# Patient Record
Sex: Female | Born: 1975 | Race: Black or African American | Hispanic: No | Marital: Married | Smoking: Never smoker
Health system: Southern US, Community
[De-identification: ages and names within clinical notes are randomized; demographics above are authoritative.]

## PROBLEM LIST (undated history)

## (undated) ENCOUNTER — Inpatient Hospital Stay (HOSPITAL_COMMUNITY): Payer: Self-pay

## (undated) DIAGNOSIS — D259 Leiomyoma of uterus, unspecified: Secondary | ICD-10-CM

## (undated) DIAGNOSIS — L408 Other psoriasis: Secondary | ICD-10-CM

## (undated) DIAGNOSIS — N841 Polyp of cervix uteri: Secondary | ICD-10-CM

## (undated) DIAGNOSIS — Z8619 Personal history of other infectious and parasitic diseases: Secondary | ICD-10-CM

## (undated) DIAGNOSIS — D563 Thalassemia minor: Secondary | ICD-10-CM

## (undated) DIAGNOSIS — O409XX0 Polyhydramnios, unspecified trimester, not applicable or unspecified: Secondary | ICD-10-CM

## (undated) DIAGNOSIS — E0590 Thyrotoxicosis, unspecified without thyrotoxic crisis or storm: Secondary | ICD-10-CM

## (undated) DIAGNOSIS — O09529 Supervision of elderly multigravida, unspecified trimester: Secondary | ICD-10-CM

## (undated) DIAGNOSIS — D219 Benign neoplasm of connective and other soft tissue, unspecified: Secondary | ICD-10-CM

## (undated) HISTORY — PX: TONSILLECTOMY: SUR1361

## (undated) HISTORY — DX: Personal history of other infectious and parasitic diseases: Z86.19

## (undated) HISTORY — DX: Thalassemia minor: D56.3

## (undated) HISTORY — DX: Other psoriasis: L40.8

## (undated) HISTORY — PX: EYE SURGERY: SHX253

## (undated) HISTORY — DX: Leiomyoma of uterus, unspecified: D25.9

## (undated) HISTORY — DX: Polyhydramnios, unspecified trimester, not applicable or unspecified: O40.9XX0

## (undated) HISTORY — DX: Polyp of cervix uteri: N84.1

## (undated) HISTORY — PX: TOE SURGERY: SHX1073

## (undated) HISTORY — DX: Supervision of elderly multigravida, unspecified trimester: O09.529

---

## 2003-05-11 ENCOUNTER — Other Ambulatory Visit: Admission: RE | Admit: 2003-05-11 | Discharge: 2003-05-11 | Payer: Self-pay | Admitting: Obstetrics and Gynecology

## 2011-10-31 LAB — OB RESULTS CONSOLE GC/CHLAMYDIA: Gonorrhea: NEGATIVE

## 2011-11-20 NOTE — L&D Delivery Note (Signed)
Operative Delivery Note At 5:14 AM a viable and healthy female was delivered via Vaginal, Vacuum Investment banker, operational).  Presentation: vertex; Position: Occiput,, Anterior; Station: +3.  Verbal consent: obtained from patient.  Risks and benefits discussed in detail.  Risks include, but are not limited to the risks of anesthesia, bleeding, infection, damage to maternal tissues, fetal cephalhematoma.  There is also the risk of inability to effect vaginal delivery of the head, or shoulder dystocia that cannot be resolved by established maneuvers, leading to the need for emergency cesarean section. KIWI vacuum used for post ctx decels to 70'S APGAR: 8, 9; weight 6 lb 14.1 oz (3120 g).   Placenta status: Abnormal Intact, Spontaneous  Pathology.   Cord: around right arm. 3 vessels with the following complications: None.  Cord pH: none  Anesthesia: Epidural  Instruments: Kiwi Episiotomy: None Lacerations: Sulcus;Vaginal;Labial Suture Repair: 3.0 chromic Est. Blood Loss (mL): 300  Mom to postpartum.  Baby to nursery-stable.  Tobby Fawcett A 08/02/2012, 7:16 AM

## 2011-12-28 LAB — OB RESULTS CONSOLE HIV ANTIBODY (ROUTINE TESTING): HIV: NONREACTIVE

## 2011-12-28 LAB — OB RESULTS CONSOLE ANTIBODY SCREEN: Antibody Screen: NEGATIVE

## 2011-12-28 LAB — OB RESULTS CONSOLE HEPATITIS B SURFACE ANTIGEN: Hepatitis B Surface Ag: NEGATIVE

## 2011-12-28 LAB — OB RESULTS CONSOLE RUBELLA ANTIBODY, IGM: Rubella: IMMUNE

## 2012-01-25 ENCOUNTER — Other Ambulatory Visit: Payer: Self-pay

## 2012-01-31 ENCOUNTER — Ambulatory Visit (HOSPITAL_COMMUNITY)
Admission: RE | Admit: 2012-01-31 | Discharge: 2012-01-31 | Disposition: A | Discharge status: Home / Self Care | Payer: BC Managed Care – PPO | Source: Ambulatory Visit | Attending: Obstetrics and Gynecology | Admitting: Obstetrics and Gynecology

## 2012-01-31 DIAGNOSIS — D563 Thalassemia minor: Secondary | ICD-10-CM | POA: Insufficient documentation

## 2012-01-31 NOTE — Progress Notes (Signed)
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      Anna Ingram's hemoglobin electrophoresis revealed normal AA hemoglobin, meaning she does not carry a common hemoglobin variant like sickle cell trait (hemoglobin S trait) or hemoglobin C trait.  Her complete blood count (CBC) revealed a low MCV value (64.7 fL), which can be low for many reasons.  Given that her ferritin level was indicated to be normal, it is likely that she carries a hemoglobin variant like thalassemia trait.  A normal hemoglobin A2 level, which her hemoglobin electrophoresis revealed, is more associated with being a carrier for alpha thalassemia trait rather than beta thalassemia trait although a normal A2 value cannot rule out beta thal trait.  Genetic testing would be available to confirm this, but is optional. Anna Ingram's hemoglobin electrophoresis and complete blood count indicated that he has hemoglobin C trait, denoted as Hgb AC. We reviewed both family histories, and Anna Ingram reported a maternal great aunt with sickle cell disease. Anna Ingram reported no known relatives with alpha thalassemia trait, hemoglobin variants, or a hemoglobinopathy.   Hemoglobin is the oxygen-carrying pigment of red blood cells.  The type of hemoglobin we have is determined by inheritance.  There are different genes that make hemoglobin; examples include alpha globin genes and beta globin genes.  It is not uncommon for a mistake to exist in a hemoglobin gene which may produce a hemoglobin variant. We reviewed the autosomal recessive inheritance of  hemoglobinopathies. A pregnancy would have a 1 in 4 (25%) chance to inherit a hemoglobinopathy, such as sickle cell disease, when both parents are carriers of a hemoglobin variant affecting the same hemoglobin chain (such as beta globin variants).   Alpha thalassemia is different in its inheritance as there are two alpha globin genes on each chromosome 16 (aa/aa).  A person can be a carrier of one alpha gene mutation (aa/a-) or of more than one mutation. Alpha thalassemia trait typically results from carrying 2 alpha globin genes with deletions (and 2 functional copies of the gene).  A person that carries only one hemoglobin variant typically has no related health concerns beyond mild anemia in some cases. A person who carriers two alpha globin gene mutations can either carry them in cis (both on the same chromosome aa/--) or in trans (on different chromosomes a-/a-) .  Southeast Asian alpha thalassemia carriers usually have a cis arrangement (aa/--) of their alpha globin gene mutations.  Thus, carriers are at risk for having a child with the most severe form of alpha thalassemia, hydrops fetalis with Hb Barts, which is associated with an absence of alpha globin chain synthesis as a result of deletions of all four alpha globin genes (--/--). Alpha thalassemia carriers of African descent, typically carry the alpha globin gene mutations in trans (a-/a-). Individuals who carry the alpha thalassemia mutations in trans are not at risk to have a baby with Hb Barts disease, but could be at risk to have a baby with hemoglobin H disease, if their partner has alpha thalassemia trait and carries the mutations in cis.  In the case where both partners carry alpha thalassemia trait in trans (  a-/a-), all of their offspring would be expected to inherit alpha thalassemia trait in trans (a-/a-). Given that Mrs. Ingram is [place], she is more like to carry alpha thalassemia trait in trans, which means that the risk for  hemoglobin disease in the current pregnancy is low.   Hemoglobin C is a hemoglobin variant that results due to a mutation in the beta globin gene.  A baby is at risk to inherit a hemoglobinopathy such as hemoglobin C disease or sickle cell disease, when both parents carry a beta globin variant, such as hemoglobin C or hemoglobin S (sickle cell trait). We discussed that prenatal diagnosis is available for a hemoglobinopathy via amniocentesis when a pregnancy is at risk to inherit it and when mutations have been identified for both parents via molecular testing.  The current pregnancy has a 1 in 2 (50%) chance to inherit hemoglobin C trait, given Anna Ingram carrier status. The baby also would be expected to be a silent carrier for alpha thalassemia (aa/a-), given that Anna Ingram does not appear to have alpha thalassemia trait, and given that Anna Ingram is most likely to carry alpha thalassemia trait in trans. Given that the couple do not carry variants that affect the same subunit of hemoglobin, according to their hemoglobin electrophoresis, complete blood count, and ferritin studies, with one carrying an alpha globin chain variant and one carrying a beta globin chain variant, the pregnancy is not expected to be at increased risk to inherit a hemoglobinopathy.  The couple understands that prenatal ultrasound would not be expected to detect features of a hemoglobinopathy in most cases (aside from hemoglobin Barts disease, for which this pregnancy is not expected to be at risk).   Both family histories were reviewed and found to be additionally contributory for mild autism in the father of the pregnancy's maternal half-nephew (his maternal half-brother's son). An underlying cause is not known for his autism. We discussed that autism is part of the spectrum of conditions referred to as Autistic spectrum disorders (ASD). We discussed that ASDs are among the most common neurodevelopmental disorders, with  approximately 1 in 88 children meeting criteria for ASD. Approximately 80% of individuals diagnosed are female. There is strong evidence that genetic factors play a critical role in development of ASD. There have been recent advances in identifying specific genetic causes of ASD, however, there are still many individuals for whom the etiology of the ASD is not known. Once a family has a child with a diagnosis of ASD, there is a 13.5% chance to have another child with ASD. If the pregnancy is female the chance is approximately 9%, and approximately 26% if the pregnancy is female. Data are limited regarding recurrence risk estimate for autism for extended degree relatives, though recurrence risk would be expected to be close to the general population risk in the case of multifactorial inheritance. They understand that at this time there is not genetic testing available for ASD for most families. Without further information regarding the provided family history, an accurate genetic risk cannot be calculated. Further genetic counseling is warranted if more information is obtained.  Mrs. Blomquist denied exposure to environmental toxins or chemical agents. She denied the use of alcohol, tobacco or street drugs. She denied significant viral illnesses during the course of her pregnancy. Her medical and surgical histories were contributory for hyperthyroidism, for which she is taking medication. She reported that she is routinely followed regarding this history and has a follow-up appointment today.  I counseled this couple regarding the above risks and available options.  The approximate face-to-face time with the genetic counselor was 35 minutes.  Anna Plowman, MS,  Certified Genetic Counselor  01/31/2012

## 2012-06-12 ENCOUNTER — Encounter (HOSPITAL_COMMUNITY): Payer: Self-pay | Admitting: *Deleted

## 2012-06-12 ENCOUNTER — Inpatient Hospital Stay (HOSPITAL_COMMUNITY): Payer: BC Managed Care – PPO

## 2012-06-12 ENCOUNTER — Inpatient Hospital Stay (HOSPITAL_COMMUNITY)
Admission: AD | Admit: 2012-06-12 | Discharge: 2012-06-12 | Disposition: A | Discharge status: Home / Self Care | Payer: BC Managed Care – PPO | Source: Ambulatory Visit | Attending: Obstetrics and Gynecology | Admitting: Obstetrics and Gynecology

## 2012-06-12 DIAGNOSIS — O368390 Maternal care for abnormalities of the fetal heart rate or rhythm, unspecified trimester, not applicable or unspecified: Secondary | ICD-10-CM | POA: Insufficient documentation

## 2012-06-12 HISTORY — DX: Benign neoplasm of connective and other soft tissue, unspecified: D21.9

## 2012-06-12 HISTORY — DX: Thyrotoxicosis, unspecified without thyrotoxic crisis or storm: E05.90

## 2012-06-12 NOTE — MAU Provider Note (Signed)
History   cc BPP 4/8 in office  No chief complaint on file.  36 yo G1P0 BF now @ 32 + weeks gestation presents for further eval after office visit today revealed BPP 4/10( -2 for BR, mvt), . Pt also had polyhydramnios, breech presentation. PNC notable for hyperthyroidism for which pt is given PTU and now is undergoing antepartum testing. NST was reassuring but not reactive at the office  OB History    Grav Para Term Preterm Abortions TAB SAB Ect Mult Living   1               Past Medical History  Diagnosis Date  . Hyperthyroidism   . Fibroids     Past Surgical History  Procedure Date  . Toe surgery     Family History  Problem Relation Age of Onset  . Hypertension Mother     History  Substance Use Topics  . Smoking status: Never Smoker   . Smokeless tobacco: Not on file  . Alcohol Use:     Allergies: No Known Allergies  Prescriptions prior to admission  Medication Sig Dispense Refill  . cetirizine (ZYRTEC) 10 MG tablet Take 10 mg by mouth daily as needed. allergies      . ferrous sulfate 325 (65 FE) MG tablet Take 325 mg by mouth at bedtime.      . fluocinolone (DERMA-SMOOTHE/FS SCALP) 0.01 % external oil Apply 1 application topically 2 (two) times a week.      . Prenatal Vit-Fe Fumarate-FA (PRENATAL MULTIVITAMIN) TABS Take 1 tablet by mouth at bedtime.      . propylthiouracil (PTU) 50 MG tablet Take 50 mg by mouth at bedtime.         Physical Exam   Blood pressure 106/68, pulse 94, temperature 98.3 F (36.8 C), temperature source Oral, resp. rate 18.  No exam performed today, sent only for testing. exam done in office. ED Course  Hyperthyroidism on PTU BPP 4/8 Polyhydramnios P) Clear liquid. Repeat NST, BPP MDM  Voula Waln A, MD 8:44 PM 06/12/2012    Addendum: repeat BPP 8/8, reactive NST. D/c home f/u one week. Daily kick ct

## 2012-06-12 NOTE — MAU Note (Signed)
Pt seen in office today and had a non reassuring NST and BBP done. BPP 4/8

## 2012-06-12 NOTE — Progress Notes (Signed)
Pt doesn't feel contractions

## 2012-06-26 LAB — OB RESULTS CONSOLE GBS: GBS: NEGATIVE

## 2012-07-07 ENCOUNTER — Encounter (HOSPITAL_COMMUNITY): Payer: Self-pay | Admitting: *Deleted

## 2012-07-07 ENCOUNTER — Telehealth (HOSPITAL_COMMUNITY): Payer: Self-pay | Admitting: *Deleted

## 2012-07-07 NOTE — Telephone Encounter (Signed)
Preadmission screen  

## 2012-07-11 ENCOUNTER — Other Ambulatory Visit: Payer: Self-pay | Admitting: Obstetrics and Gynecology

## 2012-07-31 ENCOUNTER — Inpatient Hospital Stay (HOSPITAL_COMMUNITY)
Admission: RE | Admit: 2012-07-31 | Discharge: 2012-08-04 | DRG: 373 | Disposition: A | Discharge status: Home / Self Care | Payer: BC Managed Care – PPO | Source: Ambulatory Visit | Attending: Obstetrics and Gynecology | Admitting: Obstetrics and Gynecology

## 2012-07-31 ENCOUNTER — Encounter (HOSPITAL_COMMUNITY): Payer: Self-pay

## 2012-07-31 DIAGNOSIS — E0590 Thyrotoxicosis, unspecified without thyrotoxic crisis or storm: Secondary | ICD-10-CM | POA: Diagnosis present

## 2012-07-31 DIAGNOSIS — O409XX0 Polyhydramnios, unspecified trimester, not applicable or unspecified: Secondary | ICD-10-CM | POA: Diagnosis present

## 2012-07-31 DIAGNOSIS — D649 Anemia, unspecified: Secondary | ICD-10-CM | POA: Diagnosis not present

## 2012-07-31 DIAGNOSIS — O9903 Anemia complicating the puerperium: Secondary | ICD-10-CM | POA: Diagnosis not present

## 2012-07-31 DIAGNOSIS — O99284 Endocrine, nutritional and metabolic diseases complicating childbirth: Secondary | ICD-10-CM | POA: Diagnosis present

## 2012-07-31 DIAGNOSIS — E079 Disorder of thyroid, unspecified: Secondary | ICD-10-CM | POA: Diagnosis present

## 2012-07-31 LAB — CBC
Hemoglobin: 12.4 g/dL (ref 12.0–15.0)
MCH: 25.3 pg — ABNORMAL LOW (ref 26.0–34.0)
MCHC: 33.4 g/dL (ref 30.0–36.0)
RDW: 14.9 % (ref 11.5–15.5)

## 2012-07-31 MED ORDER — OXYCODONE-ACETAMINOPHEN 5-325 MG PO TABS: 1.0000 | ORAL_TABLET | ORAL | Status: DC | PRN

## 2012-07-31 MED ORDER — OXYTOCIN 40 UNITS IN LACTATED RINGERS INFUSION - SIMPLE MED: 62.5000 mL/h | Freq: Once | INTRAVENOUS | Status: DC

## 2012-07-31 MED ORDER — IBUPROFEN 600 MG PO TABS: 600.0000 mg | ORAL_TABLET | Freq: Four times a day (QID) | ORAL | Status: DC | PRN

## 2012-07-31 MED ORDER — LACTATED RINGERS IV SOLN: INTRAVENOUS | Status: DC

## 2012-07-31 MED ORDER — TERBUTALINE SULFATE 1 MG/ML IJ SOLN
0.2500 mg | Freq: Once | INTRAMUSCULAR | Status: AC | PRN
Start: 2012-07-31 — End: 2012-07-31

## 2012-07-31 MED ORDER — CITRIC ACID-SODIUM CITRATE 334-500 MG/5ML PO SOLN: 30.0000 mL | ORAL | Status: DC | PRN

## 2012-07-31 MED ORDER — LACTATED RINGERS IV SOLN: 500.0000 mL | INTRAVENOUS | Status: DC | PRN

## 2012-07-31 MED ORDER — OXYTOCIN BOLUS FROM INFUSION
500.0000 mL | Freq: Once | INTRAVENOUS | Status: DC
  Filled 2012-07-31: qty 500

## 2012-07-31 MED ORDER — ONDANSETRON HCL 4 MG/2ML IJ SOLN: 4.0000 mg | Freq: Four times a day (QID) | INTRAMUSCULAR | Status: DC | PRN

## 2012-07-31 MED ORDER — ZOLPIDEM TARTRATE 5 MG PO TABS: 5.0000 mg | ORAL_TABLET | Freq: Every evening | ORAL | Status: DC | PRN

## 2012-07-31 MED ORDER — LIDOCAINE HCL (PF) 1 % IJ SOLN
30.0000 mL | INTRAMUSCULAR | Status: DC | PRN
  Filled 2012-07-31: qty 30

## 2012-07-31 MED ORDER — MISOPROSTOL 25 MCG QUARTER TABLET
25.0000 ug | ORAL_TABLET | ORAL | Status: DC | PRN
  Administered 2012-07-31 – 2012-08-01 (×3): 25 ug via VAGINAL
  Filled 2012-07-31 (×3): qty 0.25

## 2012-07-31 MED ORDER — ACETAMINOPHEN 325 MG PO TABS: 650.0000 mg | ORAL_TABLET | ORAL | Status: DC | PRN

## 2012-08-01 ENCOUNTER — Inpatient Hospital Stay (HOSPITAL_COMMUNITY): Payer: BC Managed Care – PPO | Admitting: Anesthesiology

## 2012-08-01 ENCOUNTER — Encounter (HOSPITAL_COMMUNITY): Payer: Self-pay | Admitting: Anesthesiology

## 2012-08-01 ENCOUNTER — Encounter (HOSPITAL_COMMUNITY): Payer: Self-pay

## 2012-08-01 LAB — RPR: RPR Ser Ql: NONREACTIVE

## 2012-08-01 MED ORDER — EPHEDRINE 5 MG/ML INJ: 10.0000 mg | INTRAVENOUS | Status: DC | PRN

## 2012-08-01 MED ORDER — PROPYLTHIOURACIL 50 MG PO TABS
50.0000 mg | ORAL_TABLET | Freq: Every day | ORAL | Status: DC
  Administered 2012-08-01: 50 mg via ORAL
  Filled 2012-08-01: qty 1

## 2012-08-01 MED ORDER — EPHEDRINE 5 MG/ML INJ
10.0000 mg | INTRAVENOUS | Status: DC | PRN
  Filled 2012-08-01: qty 4

## 2012-08-01 MED ORDER — PHENYLEPHRINE 40 MCG/ML (10ML) SYRINGE FOR IV PUSH (FOR BLOOD PRESSURE SUPPORT)
80.0000 ug | PREFILLED_SYRINGE | INTRAVENOUS | Status: DC | PRN
  Filled 2012-08-01: qty 5

## 2012-08-01 MED ORDER — DIPHENHYDRAMINE HCL 50 MG/ML IJ SOLN: 12.5000 mg | INTRAMUSCULAR | Status: DC | PRN

## 2012-08-01 MED ORDER — FENTANYL 2.5 MCG/ML BUPIVACAINE 1/10 % EPIDURAL INFUSION (WH - ANES)
14.0000 mL/h | INTRAMUSCULAR | Status: DC
  Administered 2012-08-01 – 2012-08-02 (×2): 14 mL/h via EPIDURAL
  Filled 2012-08-01 (×2): qty 60

## 2012-08-01 MED ORDER — LACTATED RINGERS IV SOLN
500.0000 mL | Freq: Once | INTRAVENOUS | Status: AC
  Administered 2012-08-01: 500 mL via INTRAVENOUS

## 2012-08-01 MED ORDER — PHENYLEPHRINE 40 MCG/ML (10ML) SYRINGE FOR IV PUSH (FOR BLOOD PRESSURE SUPPORT): 80.0000 ug | PREFILLED_SYRINGE | INTRAVENOUS | Status: DC | PRN

## 2012-08-01 MED ORDER — OXYTOCIN 40 UNITS IN LACTATED RINGERS INFUSION - SIMPLE MED
1.0000 m[IU]/min | INTRAVENOUS | Status: DC
  Administered 2012-08-01: 2 m[IU]/min via INTRAVENOUS
  Filled 2012-08-01: qty 1000

## 2012-08-01 MED ORDER — TERBUTALINE SULFATE 1 MG/ML IJ SOLN: 0.2500 mg | Freq: Once | INTRAMUSCULAR | Status: AC | PRN

## 2012-08-01 MED ORDER — LIDOCAINE HCL (PF) 1 % IJ SOLN
INTRAMUSCULAR | Status: DC | PRN
  Administered 2012-08-01 (×4): 4 mL

## 2012-08-01 NOTE — Progress Notes (Signed)
S: notes ctx. S/p cytotec x3. Asking regarding epidural  O: tracing reactive baseline 130-135 Ctx w/ couplets, spaced VE: 2/80/-2 cytotec placed  IMP: polyhydramnios Hyperthyroidism Term gestation P) exaggerated left sims position.  Start pitocin when next cytotec due

## 2012-08-01 NOTE — Progress Notes (Signed)
S. Had only one cytotec  VE: one thick/-3 deviated to left  Tracing reactive baseline 140  irreg ctx  IMP: polyhydramnios Hyperthyroidism Term P) resume cytotec. PTU ordered

## 2012-08-01 NOTE — Anesthesia Procedure Notes (Signed)
Epidural Patient location during procedure: OB Start time: 08/01/2012 10:48 PM  Staffing Performed by: anesthesiologist   Preanesthetic Checklist Completed: patient identified, site marked, surgical consent, pre-op evaluation, timeout performed, IV checked, risks and benefits discussed and monitors and equipment checked  Epidural Patient position: sitting Prep: site prepped and draped and DuraPrep Patient monitoring: continuous pulse ox and blood pressure Approach: midline Injection technique: LOR air  Needle:  Needle type: Tuohy  Needle gauge: 17 G Needle length: 9 cm and 9 Needle insertion depth: 6 cm Catheter type: closed end flexible Catheter size: 19 Gauge Catheter at skin depth: 11 cm Test dose: negative  Assessment Events: blood not aspirated, injection not painful, no injection resistance, negative IV test and no paresthesia  Additional Notes Discussed risk of headache, infection, bleeding, nerve injury and failed or incomplete block.  Patient voices understanding and wishes to proceed. Reason for block:procedure for pain

## 2012-08-01 NOTE — Anesthesia Preprocedure Evaluation (Signed)
Anesthesia Evaluation  Patient identified by MRN, date of birth, ID band Patient awake    Reviewed: Allergy & Precautions, H&P , NPO status , Patient's Chart, lab work & pertinent test results, reviewed documented beta blocker date and time   History of Anesthesia Complications Negative for: history of anesthetic complications  Airway Mallampati: III TM Distance: >3 FB Neck ROM: full    Dental  (+) Teeth Intact   Pulmonary neg pulmonary ROS,  breath sounds clear to auscultation        Cardiovascular negative cardio ROS  Rhythm:regular Rate:Normal     Neuro/Psych negative neurological ROS  negative psych ROS   GI/Hepatic negative GI ROS, Neg liver ROS,   Endo/Other  Hyperthyroidism (on PTU)   Renal/GU negative Renal ROS     Musculoskeletal   Abdominal   Peds  Hematology negative hematology ROS (+) Blood dyscrasia (alpha thalassemia minor), ,   Anesthesia Other Findings   Reproductive/Obstetrics (+) Pregnancy                           Anesthesia Physical Anesthesia Plan  ASA: II  Anesthesia Plan: Epidural   Post-op Pain Management:    Induction:   Airway Management Planned:   Additional Equipment:   Intra-op Plan:   Post-operative Plan:   Informed Consent: I have reviewed the patients History and Physical, chart, labs and discussed the procedure including the risks, benefits and alternatives for the proposed anesthesia with the patient or authorized representative who has indicated his/her understanding and acceptance.     Plan Discussed with:   Anesthesia Plan Comments:         Anesthesia Quick Evaluation

## 2012-08-01 NOTE — H&P (Signed)
Anna Ingram is a 36 y.o. female presenting @ 39 1/7 weeks  for induction 2nd to hyoperthyroidism and polyhydramnios. PNC complicated by hyperthyroidism for which pt was placed on PTU. Mother was diagnosed with alpha thal trait and FOB is Hgb C trait. History OB History    Grav Para Term Preterm Abortions TAB SAB Ect Mult Living   1              Past Medical History  Diagnosis Date  . Hyperthyroidism   . Fibroids   . Anxiety   . Polyhydramnios   . Thalassemia minor   . Leiomyoma of uterus   . Mucous polyp of cervix   . Other psoriasis    Past Surgical History  Procedure Date  . Toe surgery   . Eye surgery     lasik   Family History: family history includes Heart attack in her maternal grandmother and maternal uncle and Hypertension in her mother. Social History:  reports that she has never smoked. She does not have any smokeless tobacco history on file. She reports that she does not use illicit drugs. Her alcohol history not on file.   Prenatal Transfer Tool  Maternal Diabetes: No Genetic Screening: Normal Maternal Ultrasounds/Referrals: Abnormal:  Findings:   Other: Fetal Ultrasounds or other Referrals:  None Maternal Substance Abuse:  No Significant Maternal Medications:  Meds include: Other:  Significant Maternal Lab Results:  Lab values include: Group B Strep negative Other Comments:  hyperthyroidism on PTU. polyhydramnios. alpha thal trait  ROS neg  Dilation: 1 Effacement (%): Thick Station: -3 Exam by:: Anna Craven, RN  Blood pressure 104/66, pulse 89, temperature 98.7 F (37.1 C), temperature source Oral, resp. rate 18, height 5\' 7"  (1.702 m), weight 87.091 kg (192 lb), last menstrual period 11/01/2011. Maternal Exam:  Abdomen: Patient reports no abdominal tenderness. Fetal presentation: vertex  Introitus: Normal vulva. Ferning test: not done.  Nitrazine test: not done.  Pelvis: adequate for delivery.   Cervix: Cervix evaluated by digital exam.      Physical Exam  Constitutional: She is oriented to person, place, and time. She appears well-developed and well-nourished.  HENT:  Head: Atraumatic.  Eyes: EOM are normal.  Neck: Neck supple.  Respiratory: Breath sounds normal.  GI: Soft.  Musculoskeletal: She exhibits edema.  Neurological: She is alert and oriented to person, place, and time.  Skin: Skin is warm and dry.    Prenatal labs: ABO, Rh: O/Positive/-- (02/08 0000) Antibody: Negative (02/08 0000) Rubella: Immune (02/08 0000) RPR: Nonreactive (02/08 0000)  HBsAg: Negative (02/08 0000)  HIV: Non-reactive (02/08 0000)  GBS: Negative (08/08 0000)   Assessment/Plan: Polyhydramnios Hyperthyroidism Term gestation  P) admit routine labs. Cont PTU. Cytotec. Amniotomy prn. pitocin  Anna Ingram A 08/01/2012, 2:11 AM

## 2012-08-02 ENCOUNTER — Encounter (HOSPITAL_COMMUNITY): Payer: Self-pay

## 2012-08-02 LAB — ABO/RH: ABO/RH(D): O POS

## 2012-08-02 LAB — TYPE AND SCREEN

## 2012-08-02 MED ORDER — BENZOCAINE-MENTHOL 20-0.5 % EX AERO
1.0000 "application " | INHALATION_SPRAY | CUTANEOUS | Status: DC | PRN
  Filled 2012-08-02: qty 56

## 2012-08-02 MED ORDER — ONDANSETRON HCL 4 MG/2ML IJ SOLN: 4.0000 mg | INTRAMUSCULAR | Status: DC | PRN

## 2012-08-02 MED ORDER — FERROUS SULFATE 325 (65 FE) MG PO TABS
325.0000 mg | ORAL_TABLET | Freq: Two times a day (BID) | ORAL | Status: DC
  Administered 2012-08-02 – 2012-08-04 (×4): 325 mg via ORAL
  Filled 2012-08-02 (×4): qty 1

## 2012-08-02 MED ORDER — DIPHENHYDRAMINE HCL 25 MG PO CAPS: 25.0000 mg | ORAL_CAPSULE | Freq: Four times a day (QID) | ORAL | Status: DC | PRN

## 2012-08-02 MED ORDER — SIMETHICONE 80 MG PO CHEW: 80.0000 mg | CHEWABLE_TABLET | ORAL | Status: DC | PRN

## 2012-08-02 MED ORDER — WITCH HAZEL-GLYCERIN EX PADS: 1.0000 "application " | MEDICATED_PAD | CUTANEOUS | Status: DC | PRN

## 2012-08-02 MED ORDER — PROPYLTHIOURACIL 50 MG PO TABS
50.0000 mg | ORAL_TABLET | Freq: Every day | ORAL | Status: DC
  Administered 2012-08-03: 50 mg via ORAL
  Filled 2012-08-02 (×2): qty 1

## 2012-08-02 MED ORDER — ONDANSETRON HCL 4 MG PO TABS: 4.0000 mg | ORAL_TABLET | ORAL | Status: DC | PRN

## 2012-08-02 MED ORDER — IBUPROFEN 600 MG PO TABS
600.0000 mg | ORAL_TABLET | Freq: Four times a day (QID) | ORAL | Status: DC
  Administered 2012-08-02 – 2012-08-04 (×7): 600 mg via ORAL
  Filled 2012-08-02 (×9): qty 1

## 2012-08-02 MED ORDER — OXYCODONE-ACETAMINOPHEN 5-325 MG PO TABS
1.0000 | ORAL_TABLET | ORAL | Status: DC | PRN
  Administered 2012-08-02 (×2): 1 via ORAL
  Filled 2012-08-02 (×2): qty 1

## 2012-08-02 MED ORDER — PRENATAL MULTIVITAMIN CH
1.0000 | ORAL_TABLET | Freq: Every day | ORAL | Status: DC
  Administered 2012-08-02 – 2012-08-04 (×3): 1 via ORAL
  Filled 2012-08-02 (×3): qty 1

## 2012-08-02 MED ORDER — ZOLPIDEM TARTRATE 5 MG PO TABS: 5.0000 mg | ORAL_TABLET | Freq: Every evening | ORAL | Status: DC | PRN

## 2012-08-02 MED ORDER — LANOLIN HYDROUS EX OINT: TOPICAL_OINTMENT | CUTANEOUS | Status: DC | PRN

## 2012-08-02 MED ORDER — SENNOSIDES-DOCUSATE SODIUM 8.6-50 MG PO TABS
2.0000 | ORAL_TABLET | Freq: Every day | ORAL | Status: DC
  Administered 2012-08-02 – 2012-08-03 (×2): 2 via ORAL

## 2012-08-02 MED ORDER — DIBUCAINE 1 % RE OINT: 1.0000 "application " | TOPICAL_OINTMENT | RECTAL | Status: DC | PRN

## 2012-08-02 MED ORDER — TETANUS-DIPHTH-ACELL PERTUSSIS 5-2.5-18.5 LF-MCG/0.5 IM SUSP: 0.5000 mL | Freq: Once | INTRAMUSCULAR | Status: DC

## 2012-08-02 NOTE — Progress Notes (Signed)
S: Pitocin  O: VE 2 cm/80%/-2/-1  AROM clear fluid copious. IUPC placed. Cervix now 4/80/-2/-1 Tracing reactive baseline 140  Ctx q 2-3  mins  IMP: active phase Hyperthyroidism on PTU Polyhydramnios P) cont pitocin. May have epidural. Exaggerated left sims position

## 2012-08-02 NOTE — Progress Notes (Signed)
Eating clear liquid diet with no nausea at present, family members at bedside

## 2012-08-02 NOTE — Progress Notes (Signed)
PPD 0 SVD - interval note  S:  Reports feeling well             Tolerating po/ No nausea or vomiting             Bleeding is light             Pain controlled with Motrin and percocet             Up ad lib / ambulatory without assistance / voiding QS  Newborn breast-feeding  O:  A & O x 3              VS: Blood pressure 96/60, pulse 90, temperature 97.8 F (36.6 C), temperature source Oral, resp. rate 18, height 5\' 7"  (1.702 m), weight 87.091 kg (192 lb), last menstrual period 11/01/2011, SpO2 100.00%, unknown if currently breastfeeding.  Fundus: firm, non-tender, Ueven  Perineum: ice pack in place  Lochia: moderate  Extremities: no edema, no calf pain or tenderness   A: PPD # 0 SVD   Doing well - stable status  P:  Routine post partum orders   Marlinda Mike CNM, MSN 08/02/2012, 11:31 AM

## 2012-08-02 NOTE — Progress Notes (Signed)
S: Called for delivery Epidural O: tracing reviewed. Baseline 140 (+) accels some variable decels. Ctx q 2 mins Pitocin  VE: fully (+) station. vtx visible  IMP: complete Hyperthyroidism on PTU P) cont pushing

## 2012-08-02 NOTE — Progress Notes (Signed)
Dr. Cherly Hensen called about SVE, FHR change and interventions.  New order received to stop pitocin and labor patient down.  Will call Dr. Cherly Hensen if interventions ineffective.

## 2012-08-02 NOTE — Anesthesia Postprocedure Evaluation (Signed)
  Anesthesia Post-op Note  Patient: Anna Ingram  Procedure(s) Performed: * No procedures listed *  Patient Location: PACU and Mother/Baby  Anesthesia Type: Epidural  Level of Consciousness: awake, alert  and oriented  Airway and Oxygen Therapy: Patient Spontanous Breathing    Post-op Assessment: Patient's Cardiovascular Status Stable and Respiratory Function Stable  Post-op Vital Signs: stable  Complications: No apparent anesthesia complications

## 2012-08-03 LAB — CBC
HCT: 29.7 % — ABNORMAL LOW (ref 36.0–46.0)
Hemoglobin: 9.6 g/dL — ABNORMAL LOW (ref 12.0–15.0)
MCH: 24.9 pg — ABNORMAL LOW (ref 26.0–34.0)
RBC: 3.86 MIL/uL — ABNORMAL LOW (ref 3.87–5.11)

## 2012-08-03 NOTE — Progress Notes (Signed)
PPD 1 SVD  S:  Reports feeling well - tired             Tolerating po/ No nausea or vomiting             Bleeding is moderate             Pain controlled with motrin and percocet             Up ad lib / ambulatory  Newborn breast feeding     O:  A & O x 3              VS: Blood pressure 92/61, pulse 76, temperature 97.4 F (36.3 C), temperature source Oral, resp. rate 16, height 5\' 7"  (1.702 m), weight 87.091 kg (192 lb), last menstrual period 11/01/2011, SpO2 96.00%, unknown if currently breastfeeding.  LABS: WBC/Hgb/Hct/Plts:  7.3/9.6/29.7/145 (09/15 0538)   Lungs: Clear and unlabored  Heart: regular rate and rhythm  Abdomen: soft, non-tender, non-distended              Fundus: firm, non-tender, U-1  Perineum: mild edema  Lochia: light  Extremities: no edema, no calf pain or tenderness    A: PPD # 1   Doing well - stable status  P:  Routine post partum orders    Marlinda Mike CNM, MSN 08/03/2012, 11:30 AM

## 2012-08-04 MED ORDER — OXYCODONE-ACETAMINOPHEN 5-325 MG PO TABS: 1.0000 | ORAL_TABLET | ORAL | Status: AC | PRN

## 2012-08-04 MED ORDER — IBUPROFEN 600 MG PO TABS: 600.0000 mg | ORAL_TABLET | Freq: Four times a day (QID) | ORAL | Status: AC

## 2012-08-04 NOTE — Progress Notes (Addendum)
PPD 2 SVD  S:  Reports feeling well - ready to go home             Tolerating po/ No nausea or vomiting             Bleeding is light             Pain controlled with motrin and percocet             Up ad lib / ambulatory / voiding QS  Newborn breast feeding  / Circ done   O:  A & O x 3              VS: Blood pressure 90/56, pulse 82, temperature 98.3 F (36.8 C), temperature source Oral, resp. rate 18, height 5\' 7"  (1.702 m), weight 87.091 kg (192 lb), last menstrual period 11/01/2011, SpO2 96.00%, unknown if currently breastfeeding.  Abdomen: soft, non-tender, non-distended              Fundus: firm, non-tender, U-1  Perineum: no edema  Lochia: light  Extremities: no edema, no calf pain or tenderness    A: PPD # 2              Alpha Thalassemia - stable anemia             Hyperthyroidism - stable on PTU  Doing well - stable status  P:  Routine post partum orders  discharge home  Marlinda Mike CNM, MSN 08/04/2012, 8:49 AM

## 2012-08-04 NOTE — Discharge Summary (Signed)
Obstetric Discharge Summary  Reason for Admission: onset of labor Prenatal Procedures: ultrasound Intrapartum Procedures: spontaneous vaginal delivery Postpartum Procedures: none Complications-Operative and Postpartum: 1st degree perineal laceration Hemoglobin  Date Value Range Status  08/03/2012 9.6* 12.0 - 15.0 g/dL Final     HCT  Date Value Range Status  08/03/2012 29.7* 36.0 - 46.0 % Final    Physical Exam:  General: alert, cooperative and no distress Lochia: appropriate Uterine Fundus: firm Incision: healing well DVT Evaluation: No evidence of DVT seen on physical exam.  Discharge Diagnoses: Term Pregnancy-delivered / hyperthyroidism - stable / Alpha-Thalasemia - mild anemia  Discharge Information: Date: 08/04/2012 Activity: pelvic rest Diet: routine Medications: PNV, Ibuprofen, Colace, Iron, Percocet and PTU and zyrtec Condition: stable Instructions: refer to practice specific booklet Discharge to: home Follow-up Information    Follow up with COUSINS,[name], MD. Schedule an appointment as soon as possible for a visit in 6 weeks. (See Dr Chestine Spore - endocrinologist in next 2-4 weeks for labs / med adjustment if needed)    Contact information:   [address] Anna Ingram [address] [phone]          Newborn Data: Live born female  Birth Weight: 6 lb 14.1 oz (3120 g) APGAR: 8, 9  Home with mother.  Anna Ingram 08/04/2012, 8:53 AM

## 2013-11-19 NOTE — L&D Delivery Note (Signed)
Delivery Note At 9:34 AM a viable and healthy female was delivered via Vaginal, Vacuum (Extractor) (Presentation: right  Occiput Posterior).  APGAR: 9, 9; weight 7 lb 4 oz (3289 g).   Placenta status: Intact, Spontaneous Pathology.  Cord: 3 vessels with the following complications: None.  Cord pH: none  Anesthesia: Epidural  Episiotomy: None Lacerations: None Suture Repair: none Est. Blood Loss (mL): 400  Mom to postpartum.  Baby to Couplet care / Skin to Skin.  Anna Ingram A 08/31/2014, 2:32 AM

## 2014-02-08 LAB — OB RESULTS CONSOLE GC/CHLAMYDIA
CHLAMYDIA, DNA PROBE: NEGATIVE
Gonorrhea: NEGATIVE

## 2014-02-08 LAB — OB RESULTS CONSOLE ABO/RH: RH Type: POSITIVE

## 2014-02-08 LAB — OB RESULTS CONSOLE RPR: RPR: NONREACTIVE

## 2014-02-08 LAB — OB RESULTS CONSOLE ANTIBODY SCREEN: Antibody Screen: NEGATIVE

## 2014-02-08 LAB — OB RESULTS CONSOLE RUBELLA ANTIBODY, IGM: RUBELLA: IMMUNE

## 2014-02-08 LAB — OB RESULTS CONSOLE HIV ANTIBODY (ROUTINE TESTING): HIV: NONREACTIVE

## 2014-02-08 LAB — OB RESULTS CONSOLE HEPATITIS B SURFACE ANTIGEN: Hepatitis B Surface Ag: NEGATIVE

## 2014-02-23 ENCOUNTER — Other Ambulatory Visit: Payer: Self-pay

## 2014-04-08 ENCOUNTER — Other Ambulatory Visit (HOSPITAL_COMMUNITY): Payer: Self-pay | Admitting: Obstetrics and Gynecology

## 2014-04-08 DIAGNOSIS — Z3689 Encounter for other specified antenatal screening: Secondary | ICD-10-CM

## 2014-04-08 DIAGNOSIS — O358XX0 Maternal care for other (suspected) fetal abnormality and damage, not applicable or unspecified: Secondary | ICD-10-CM

## 2014-04-08 DIAGNOSIS — O09529 Supervision of elderly multigravida, unspecified trimester: Secondary | ICD-10-CM

## 2014-04-08 DIAGNOSIS — O35BXX0 Maternal care for other (suspected) fetal abnormality and damage, fetal cardiac anomalies, not applicable or unspecified: Secondary | ICD-10-CM

## 2014-04-22 ENCOUNTER — Encounter (HOSPITAL_COMMUNITY): Payer: Self-pay

## 2014-04-22 ENCOUNTER — Ambulatory Visit (HOSPITAL_COMMUNITY)
Admission: RE | Admit: 2014-04-22 | Discharge: 2014-04-22 | Disposition: A | Discharge status: Home / Self Care | Payer: BC Managed Care – PPO | Source: Ambulatory Visit | Attending: Obstetrics and Gynecology | Admitting: Obstetrics and Gynecology

## 2014-04-22 VITALS — BP 106/62 | HR 98 | Wt 185.0 lb

## 2014-04-22 DIAGNOSIS — O09529 Supervision of elderly multigravida, unspecified trimester: Secondary | ICD-10-CM | POA: Insufficient documentation

## 2014-04-22 DIAGNOSIS — O35BXX0 Maternal care for other (suspected) fetal abnormality and damage, fetal cardiac anomalies, not applicable or unspecified: Secondary | ICD-10-CM

## 2014-04-22 DIAGNOSIS — Z3689 Encounter for other specified antenatal screening: Secondary | ICD-10-CM | POA: Insufficient documentation

## 2014-04-22 DIAGNOSIS — O358XX0 Maternal care for other (suspected) fetal abnormality and damage, not applicable or unspecified: Secondary | ICD-10-CM | POA: Insufficient documentation

## 2014-04-22 DIAGNOSIS — IMO0001 Reserved for inherently not codable concepts without codable children: Secondary | ICD-10-CM | POA: Insufficient documentation

## 2014-05-13 ENCOUNTER — Ambulatory Visit (HOSPITAL_COMMUNITY)
Admission: RE | Admit: 2014-05-13 | Discharge: 2014-05-13 | Disposition: A | Discharge status: Home / Self Care | Payer: BC Managed Care – PPO | Source: Ambulatory Visit | Attending: Obstetrics and Gynecology | Admitting: Obstetrics and Gynecology

## 2014-05-13 DIAGNOSIS — Z3689 Encounter for other specified antenatal screening: Secondary | ICD-10-CM | POA: Insufficient documentation

## 2014-05-13 DIAGNOSIS — IMO0001 Reserved for inherently not codable concepts without codable children: Secondary | ICD-10-CM | POA: Insufficient documentation

## 2014-06-08 ENCOUNTER — Other Ambulatory Visit (HOSPITAL_COMMUNITY): Payer: Self-pay | Admitting: Obstetrics and Gynecology

## 2014-06-08 DIAGNOSIS — O3410 Maternal care for benign tumor of corpus uteri, unspecified trimester: Secondary | ICD-10-CM

## 2014-06-08 DIAGNOSIS — O99280 Endocrine, nutritional and metabolic diseases complicating pregnancy, unspecified trimester: Secondary | ICD-10-CM

## 2014-06-08 DIAGNOSIS — O358XX0 Maternal care for other (suspected) fetal abnormality and damage, not applicable or unspecified: Secondary | ICD-10-CM

## 2014-06-08 DIAGNOSIS — E079 Disorder of thyroid, unspecified: Secondary | ICD-10-CM

## 2014-06-08 DIAGNOSIS — O09529 Supervision of elderly multigravida, unspecified trimester: Secondary | ICD-10-CM

## 2014-06-08 DIAGNOSIS — O43899 Other placental disorders, unspecified trimester: Secondary | ICD-10-CM

## 2014-07-01 ENCOUNTER — Other Ambulatory Visit (HOSPITAL_COMMUNITY): Payer: Self-pay | Admitting: Obstetrics and Gynecology

## 2014-07-01 ENCOUNTER — Ambulatory Visit (HOSPITAL_COMMUNITY)
Admission: RE | Admit: 2014-07-01 | Discharge: 2014-07-01 | Disposition: A | Discharge status: Home / Self Care | Payer: BC Managed Care – PPO | Source: Ambulatory Visit | Attending: Obstetrics and Gynecology | Admitting: Obstetrics and Gynecology

## 2014-07-01 ENCOUNTER — Encounter (HOSPITAL_COMMUNITY): Payer: Self-pay

## 2014-07-01 VITALS — BP 102/60 | HR 99 | Wt 192.2 lb

## 2014-07-01 DIAGNOSIS — O09529 Supervision of elderly multigravida, unspecified trimester: Secondary | ICD-10-CM | POA: Diagnosis not present

## 2014-07-01 DIAGNOSIS — E079 Disorder of thyroid, unspecified: Secondary | ICD-10-CM

## 2014-07-01 DIAGNOSIS — E039 Hypothyroidism, unspecified: Secondary | ICD-10-CM

## 2014-07-01 DIAGNOSIS — O43899 Other placental disorders, unspecified trimester: Secondary | ICD-10-CM

## 2014-07-01 DIAGNOSIS — O358XX0 Maternal care for other (suspected) fetal abnormality and damage, not applicable or unspecified: Secondary | ICD-10-CM

## 2014-07-01 DIAGNOSIS — Z3689 Encounter for other specified antenatal screening: Secondary | ICD-10-CM | POA: Insufficient documentation

## 2014-07-01 DIAGNOSIS — O99280 Endocrine, nutritional and metabolic diseases complicating pregnancy, unspecified trimester: Secondary | ICD-10-CM

## 2014-07-01 DIAGNOSIS — O3410 Maternal care for benign tumor of corpus uteri, unspecified trimester: Secondary | ICD-10-CM

## 2014-07-01 DIAGNOSIS — O4403 Placenta previa specified as without hemorrhage, third trimester: Secondary | ICD-10-CM

## 2014-07-01 DIAGNOSIS — O99283 Endocrine, nutritional and metabolic diseases complicating pregnancy, third trimester: Secondary | ICD-10-CM

## 2014-08-11 LAB — OB RESULTS CONSOLE GBS: GBS: NEGATIVE

## 2014-08-16 ENCOUNTER — Other Ambulatory Visit: Payer: Self-pay | Admitting: Obstetrics and Gynecology

## 2014-08-19 ENCOUNTER — Telehealth (HOSPITAL_COMMUNITY): Payer: Self-pay | Admitting: *Deleted

## 2014-08-19 ENCOUNTER — Encounter (HOSPITAL_COMMUNITY): Payer: Self-pay | Admitting: *Deleted

## 2014-08-19 NOTE — Telephone Encounter (Signed)
Preadmission screen  

## 2014-08-29 ENCOUNTER — Encounter (HOSPITAL_COMMUNITY): Payer: Self-pay

## 2014-08-29 ENCOUNTER — Inpatient Hospital Stay (HOSPITAL_COMMUNITY)
Admission: RE | Admit: 2014-08-29 | Discharge: 2014-08-31 | DRG: 774 | Disposition: A | Discharge status: Home / Self Care | Payer: BC Managed Care – PPO | Source: Ambulatory Visit | Attending: Obstetrics and Gynecology | Admitting: Obstetrics and Gynecology

## 2014-08-29 ENCOUNTER — Inpatient Hospital Stay (HOSPITAL_COMMUNITY)
Admission: AD | Admit: 2014-08-29 | Discharge: 2014-08-29 | Disposition: A | Discharge status: Home / Self Care | Payer: BC Managed Care – PPO | Source: Ambulatory Visit | Attending: Obstetrics and Gynecology | Admitting: Obstetrics and Gynecology

## 2014-08-29 DIAGNOSIS — Z8249 Family history of ischemic heart disease and other diseases of the circulatory system: Secondary | ICD-10-CM

## 2014-08-29 DIAGNOSIS — E0590 Thyrotoxicosis, unspecified without thyrotoxic crisis or storm: Secondary | ICD-10-CM | POA: Diagnosis present

## 2014-08-29 DIAGNOSIS — Z3A39 39 weeks gestation of pregnancy: Secondary | ICD-10-CM | POA: Diagnosis present

## 2014-08-29 DIAGNOSIS — O09523 Supervision of elderly multigravida, third trimester: Secondary | ICD-10-CM | POA: Diagnosis not present

## 2014-08-29 DIAGNOSIS — O99283 Endocrine, nutritional and metabolic diseases complicating pregnancy, third trimester: Secondary | ICD-10-CM

## 2014-08-29 DIAGNOSIS — O99284 Endocrine, nutritional and metabolic diseases complicating childbirth: Secondary | ICD-10-CM | POA: Diagnosis present

## 2014-08-29 DIAGNOSIS — O4593 Premature separation of placenta, unspecified, third trimester: Secondary | ICD-10-CM | POA: Diagnosis present

## 2014-08-29 LAB — TYPE AND SCREEN
ABO/RH(D): O POS
ANTIBODY SCREEN: NEGATIVE

## 2014-08-29 LAB — CBC
HCT: 38.3 % (ref 36.0–46.0)
Hemoglobin: 12.8 g/dL (ref 12.0–15.0)
MCH: 26 pg (ref 26.0–34.0)
MCHC: 33.4 g/dL (ref 30.0–36.0)
MCV: 77.8 fL — ABNORMAL LOW (ref 78.0–100.0)
Platelets: 197 10*3/uL (ref 150–400)
RBC: 4.92 MIL/uL (ref 3.87–5.11)
RDW: 14.4 % (ref 11.5–15.5)
WBC: 5 10*3/uL (ref 4.0–10.5)

## 2014-08-29 MED ORDER — TERBUTALINE SULFATE 1 MG/ML IJ SOLN: 0.2500 mg | Freq: Once | INTRAMUSCULAR | Status: AC | PRN

## 2014-08-29 MED ORDER — OXYTOCIN 40 UNITS IN LACTATED RINGERS INFUSION - SIMPLE MED: 62.5000 mL/h | INTRAVENOUS | Status: DC

## 2014-08-29 MED ORDER — LACTATED RINGERS IV SOLN: 500.0000 mL | INTRAVENOUS | Status: DC | PRN

## 2014-08-29 MED ORDER — NALBUPHINE HCL 10 MG/ML IJ SOLN: 5.0000 mg | INTRAMUSCULAR | Status: DC | PRN

## 2014-08-29 MED ORDER — LACTATED RINGERS IV SOLN
INTRAVENOUS | Status: DC
  Administered 2014-08-29 – 2014-08-30 (×3): via INTRAVENOUS

## 2014-08-29 MED ORDER — MISOPROSTOL 25 MCG QUARTER TABLET: 25.0000 ug | ORAL_TABLET | ORAL | Status: DC | PRN

## 2014-08-29 MED ORDER — LIDOCAINE HCL (PF) 1 % IJ SOLN
30.0000 mL | INTRAMUSCULAR | Status: DC | PRN
  Filled 2014-08-29: qty 30

## 2014-08-29 MED ORDER — ONDANSETRON HCL 4 MG/2ML IJ SOLN: 4.0000 mg | Freq: Four times a day (QID) | INTRAMUSCULAR | Status: DC | PRN

## 2014-08-29 MED ORDER — OXYTOCIN 40 UNITS IN LACTATED RINGERS INFUSION - SIMPLE MED
1.0000 m[IU]/min | INTRAVENOUS | Status: DC
  Administered 2014-08-29: 2 m[IU]/min via INTRAVENOUS
  Filled 2014-08-29: qty 1000

## 2014-08-29 MED ORDER — OXYCODONE-ACETAMINOPHEN 5-325 MG PO TABS: 2.0000 | ORAL_TABLET | ORAL | Status: DC | PRN

## 2014-08-29 MED ORDER — ACETAMINOPHEN 325 MG PO TABS: 650.0000 mg | ORAL_TABLET | ORAL | Status: DC | PRN

## 2014-08-29 MED ORDER — BUTORPHANOL TARTRATE 1 MG/ML IJ SOLN
1.0000 mg | INTRAMUSCULAR | Status: DC | PRN
  Administered 2014-08-30: 1 mg via INTRAVENOUS
  Filled 2014-08-29: qty 1

## 2014-08-29 MED ORDER — CITRIC ACID-SODIUM CITRATE 334-500 MG/5ML PO SOLN: 30.0000 mL | ORAL | Status: DC | PRN

## 2014-08-29 MED ORDER — OXYTOCIN BOLUS FROM INFUSION: 500.0000 mL | INTRAVENOUS | Status: DC

## 2014-08-29 MED ORDER — OXYTOCIN 10 UNIT/ML IJ SOLN: 10.0000 [IU] | Freq: Once | INTRAMUSCULAR | Status: DC

## 2014-08-29 MED ORDER — OXYCODONE-ACETAMINOPHEN 5-325 MG PO TABS: 1.0000 | ORAL_TABLET | ORAL | Status: DC | PRN

## 2014-08-29 MED ORDER — ZOLPIDEM TARTRATE 5 MG PO TABS: 5.0000 mg | ORAL_TABLET | Freq: Every evening | ORAL | Status: DC | PRN

## 2014-08-29 MED ORDER — PROPYLTHIOURACIL 50 MG PO TABS
50.0000 mg | ORAL_TABLET | Freq: Every day | ORAL | Status: DC
  Administered 2014-08-29 – 2014-08-30 (×2): 50 mg via ORAL
  Filled 2014-08-29 (×5): qty 1

## 2014-08-29 NOTE — H&P (Signed)
Anna Ingram is a 38 y.o. female presenting for IOL 2nd to hyperthyroidism managed with PTU. PNC complicated by 2 EIF and umbilical cord cysts x 2 with normal genetic testing History OB History   Grav Para Term Preterm Abortions TAB SAB Ect Mult Living   2 1 1  0 0 0 0 0 0 1     Past Medical History  Diagnosis Date  . Hyperthyroidism   . Fibroids   . Anxiety   . Polyhydramnios   . Thalassemia minor   . Leiomyoma of uterus   . Mucous polyp of cervix   . Other psoriasis   . AMA (advanced maternal age) multigravida 35+    Past Surgical History  Procedure Laterality Date  . Toe surgery    . Eye surgery      lasik  . Tonsillectomy     Family History: family history includes Heart attack in her maternal grandmother and maternal uncle; Hypertension in her mother. Social History:  reports that she has never smoked. She has never used smokeless tobacco. She reports that she does not use illicit drugs. Her alcohol history is not on file.   Prenatal Transfer Tool  Maternal Diabetes: No Genetic Screening: Normal Maternal Ultrasounds/Referrals: Abnormal:  Findings:   Isolated EIF (echogenic intracardiac focus), Other: Fetal Ultrasounds or other Referrals:  Referred to Materal Fetal Medicine  Maternal Substance Abuse:  No Significant Maternal Medications:  Meds include: Other:  Significant Maternal Lab Results:  Lab values include: Group B Strep negative Other Comments:  hyperthyroidism on PTU, umbilical cord cysts x 2  Review of Systems  All other systems reviewed and are negative.     Blood pressure 103/69, pulse 92, temperature 98.1 F (36.7 C), temperature source Oral, resp. rate 20, height 5\' 7"  (1.702 m), weight 92.08 kg (203 lb), last menstrual period 11/28/2013, unknown if currently breastfeeding. Maternal Exam:  Uterine Assessment: Contraction strength is mild.  Contraction frequency is rare.      Physical Exam  Constitutional: She is oriented to person, place, and  time. She appears well-developed and well-nourished.  HENT:  Head: Atraumatic.  Eyes: EOM are normal.  Neck: Neck supple.  Cardiovascular: Normal rate.   Respiratory: Effort normal.  GI: Soft.  Musculoskeletal: Normal range of motion. She exhibits no edema.  Neurological: She is alert and oriented to person, place, and time.  Skin: Skin is warm and dry.  Psychiatric: She has a normal mood and affect.   ve 1/70/-2 post  Prenatal labs: ABO, Rh: O/Positive/-- (03/23 0000) Antibody: Negative (03/23 0000) Rubella: Immune (03/23 0000) RPR: Nonreactive (03/23 0000)  HBsAg: Negative (03/23 0000)  HIV: Non-reactive (03/23 0000)  GBS: Negative (09/23 0000)   Assessment/Plan: Hyperthyroidism  Term gestation Umbilical cord cysts P) admit routine labs. Cont PTU 50mg  po qd. Intracervical balloon analgesic/epidural prn  Tajh Livsey A 08/29/2014, 9:05 PM

## 2014-08-30 ENCOUNTER — Encounter (HOSPITAL_COMMUNITY): Payer: Self-pay

## 2014-08-30 ENCOUNTER — Inpatient Hospital Stay (HOSPITAL_COMMUNITY): Payer: BC Managed Care – PPO | Admitting: Anesthesiology

## 2014-08-30 ENCOUNTER — Encounter (HOSPITAL_COMMUNITY): Payer: BC Managed Care – PPO | Admitting: Anesthesiology

## 2014-08-30 LAB — FIBRINOGEN: Fibrinogen: 562 mg/dL — ABNORMAL HIGH (ref 204–475)

## 2014-08-30 LAB — PROTIME-INR
INR: 1.08 (ref 0.00–1.49)
PROTHROMBIN TIME: 14.1 s (ref 11.6–15.2)

## 2014-08-30 LAB — SAVE SMEAR

## 2014-08-30 LAB — APTT: aPTT: 29 seconds (ref 24–37)

## 2014-08-30 LAB — HIV ANTIBODY (ROUTINE TESTING W REFLEX): HIV: NONREACTIVE

## 2014-08-30 LAB — RPR

## 2014-08-30 LAB — PLATELET COUNT: Platelets: 199 10*3/uL (ref 150–400)

## 2014-08-30 MED ORDER — OXYCODONE-ACETAMINOPHEN 5-325 MG PO TABS: 2.0000 | ORAL_TABLET | ORAL | Status: DC | PRN

## 2014-08-30 MED ORDER — FENTANYL 2.5 MCG/ML BUPIVACAINE 1/10 % EPIDURAL INFUSION (WH - ANES)
INTRAMUSCULAR | Status: DC | PRN
  Administered 2014-08-30: 14 mL/h via EPIDURAL

## 2014-08-30 MED ORDER — DIPHENHYDRAMINE HCL 50 MG/ML IJ SOLN: 12.5000 mg | INTRAMUSCULAR | Status: DC | PRN

## 2014-08-30 MED ORDER — WITCH HAZEL-GLYCERIN EX PADS
1.0000 "application " | MEDICATED_PAD | CUTANEOUS | Status: DC | PRN
  Administered 2014-08-30: 1 via TOPICAL

## 2014-08-30 MED ORDER — LIDOCAINE HCL (PF) 1 % IJ SOLN
INTRAMUSCULAR | Status: DC | PRN
  Administered 2014-08-30 (×2): 4 mL

## 2014-08-30 MED ORDER — PHENYLEPHRINE 40 MCG/ML (10ML) SYRINGE FOR IV PUSH (FOR BLOOD PRESSURE SUPPORT)
80.0000 ug | PREFILLED_SYRINGE | INTRAVENOUS | Status: DC | PRN
  Filled 2014-08-30 (×2): qty 10
  Filled 2014-08-30: qty 2

## 2014-08-30 MED ORDER — FENTANYL 2.5 MCG/ML BUPIVACAINE 1/10 % EPIDURAL INFUSION (WH - ANES)
14.0000 mL/h | INTRAMUSCULAR | Status: DC | PRN
  Administered 2014-08-30: 14 mL/h via EPIDURAL
  Filled 2014-08-30: qty 125

## 2014-08-30 MED ORDER — PHENYLEPHRINE 40 MCG/ML (10ML) SYRINGE FOR IV PUSH (FOR BLOOD PRESSURE SUPPORT)
80.0000 ug | PREFILLED_SYRINGE | INTRAVENOUS | Status: DC | PRN
  Administered 2014-08-30: 80 ug via INTRAVENOUS
  Filled 2014-08-30: qty 2

## 2014-08-30 MED ORDER — SIMETHICONE 80 MG PO CHEW: 80.0000 mg | CHEWABLE_TABLET | ORAL | Status: DC | PRN

## 2014-08-30 MED ORDER — EPHEDRINE 5 MG/ML INJ
10.0000 mg | INTRAVENOUS | Status: DC | PRN
  Filled 2014-08-30: qty 2

## 2014-08-30 MED ORDER — DIBUCAINE 1 % RE OINT: 1.0000 "application " | TOPICAL_OINTMENT | RECTAL | Status: DC | PRN

## 2014-08-30 MED ORDER — LANOLIN HYDROUS EX OINT: TOPICAL_OINTMENT | CUTANEOUS | Status: DC | PRN

## 2014-08-30 MED ORDER — LACTATED RINGERS IV SOLN
500.0000 mL | Freq: Once | INTRAVENOUS | Status: AC
  Administered 2014-08-30: 500 mL via INTRAVENOUS

## 2014-08-30 MED ORDER — FERROUS SULFATE 325 (65 FE) MG PO TABS
325.0000 mg | ORAL_TABLET | Freq: Two times a day (BID) | ORAL | Status: DC
  Administered 2014-08-30 – 2014-08-31 (×2): 325 mg via ORAL
  Filled 2014-08-30 (×2): qty 1

## 2014-08-30 MED ORDER — BENZOCAINE-MENTHOL 20-0.5 % EX AERO
1.0000 "application " | INHALATION_SPRAY | CUTANEOUS | Status: DC | PRN
  Administered 2014-08-30: 1 via TOPICAL
  Filled 2014-08-30: qty 56

## 2014-08-30 MED ORDER — PRENATAL MULTIVITAMIN CH
1.0000 | ORAL_TABLET | Freq: Every day | ORAL | Status: DC
  Administered 2014-08-31: 1 via ORAL
  Filled 2014-08-30: qty 1

## 2014-08-30 MED ORDER — SENNOSIDES-DOCUSATE SODIUM 8.6-50 MG PO TABS
2.0000 | ORAL_TABLET | ORAL | Status: DC
  Filled 2014-08-30: qty 2

## 2014-08-30 MED ORDER — ZOLPIDEM TARTRATE 5 MG PO TABS: 5.0000 mg | ORAL_TABLET | Freq: Every evening | ORAL | Status: DC | PRN

## 2014-08-30 MED ORDER — ONDANSETRON HCL 4 MG PO TABS: 4.0000 mg | ORAL_TABLET | ORAL | Status: DC | PRN

## 2014-08-30 MED ORDER — OXYCODONE-ACETAMINOPHEN 5-325 MG PO TABS: 1.0000 | ORAL_TABLET | ORAL | Status: DC | PRN

## 2014-08-30 MED ORDER — DIPHENHYDRAMINE HCL 25 MG PO CAPS: 25.0000 mg | ORAL_CAPSULE | Freq: Four times a day (QID) | ORAL | Status: DC | PRN

## 2014-08-30 MED ORDER — LACTATED RINGERS IV SOLN: INTRAVENOUS | Status: DC

## 2014-08-30 MED ORDER — ONDANSETRON HCL 4 MG/2ML IJ SOLN: 4.0000 mg | INTRAMUSCULAR | Status: DC | PRN

## 2014-08-30 MED ORDER — IBUPROFEN 600 MG PO TABS
600.0000 mg | ORAL_TABLET | Freq: Four times a day (QID) | ORAL | Status: DC
  Administered 2014-08-30 – 2014-08-31 (×5): 600 mg via ORAL
  Filled 2014-08-30 (×5): qty 1

## 2014-08-30 NOTE — Progress Notes (Signed)
S: comfortable  O: Epidural Pitocin 16 miu VE 6/patulous/-3 applied. Palp clot( large ) removed asynclytic AROM clear fluid IUPC/ISE placed  Tracing baseline 130 (+) accel Variable decel with ctx Ctx q 3-65mins  ImP: Presumed placental abruption Hyperthyroidism on PTU Term gestation  P) dic panel. Cont with pitocin for vaginal delivery, exaggerated sims position. Close fetal monitor  Amnioinfusion.

## 2014-08-30 NOTE — Lactation Note (Signed)
This note was copied from the chart of Anna Dalisa Cajamarca. Lactation Consultation Note  Baby sleeping with mother.  P2, Ex BF.  Mother states she had some nipple soreness last time. Discussed how to achieve a deeper latch. Reviewed breastfeeding positions.  Reviewed hand expression, mother states they reviewed with her in L&D also. Discussed stomach size and cluster feeding. Mom made aware of O/P services, breastfeeding support groups, community resources, and our phone # for post-discharge questions.    Patient Name: Anna Ingram ELTRV'U Date: 08/30/2014 Reason for consult: Initial assessment   Maternal Data Has patient been taught Hand Expression?: Yes Does the patient have breastfeeding experience prior to this delivery?: Yes  Feeding Feeding Type: Breast Fed Length of feed: 5 min  LATCH Score/Interventions                      Lactation Tools Discussed/Used     Consult Status Consult Status: Follow-up Date: 08/31/14 Follow-up type: In-patient    [name] [facility] 08/30/2014, 5:20 PM

## 2014-08-30 NOTE — Anesthesia Postprocedure Evaluation (Signed)
Anesthesia Post Note  Patient: Anna Ingram  Procedure(s) Performed: * No procedures listed *  Anesthesia type: Epidural  Patient location: Mother/Baby  Post pain: Pain level controlled  Post assessment: Post-op Vital signs reviewed  Last Vitals:  Filed Vitals:   08/30/14 1642  BP: 95/55  Pulse: 85  Temp: 36.8 C  Resp: 18    Post vital signs: Reviewed  Level of consciousness:alert  Complications: No apparent anesthesia complications

## 2014-08-30 NOTE — Anesthesia Preprocedure Evaluation (Signed)
Anesthesia Evaluation  Patient identified by MRN, date of birth, ID band Patient awake    Reviewed: Allergy & Precautions, H&P , NPO status , Patient's Chart, lab work & pertinent test results, reviewed documented beta blocker date and time   History of Anesthesia Complications Negative for: history of anesthetic complications  Airway Mallampati: III TM Distance: >3 FB Neck ROM: full    Dental  (+) Teeth Intact   Pulmonary neg pulmonary ROS,  breath sounds clear to auscultation        Cardiovascular negative cardio ROS  Rhythm:regular Rate:Normal     Neuro/Psych PSYCHIATRIC DISORDERS Anxiety negative neurological ROS     GI/Hepatic negative GI ROS, Neg liver ROS,   Endo/Other  Hypothyroidism Hyperthyroidism (on PTU)   Renal/GU negative Renal ROS     Musculoskeletal   Abdominal   Peds  Hematology  (+) Blood dyscrasia (alpha thalassemia minor), anemia ,   Anesthesia Other Findings   Reproductive/Obstetrics (+) Pregnancy                           Anesthesia Physical  Anesthesia Plan  ASA: II  Anesthesia Plan: Epidural   Post-op Pain Management:    Induction:   Airway Management Planned:   Additional Equipment:   Intra-op Plan:   Post-operative Plan:   Informed Consent: I have reviewed the patients History and Physical, chart, labs and discussed the procedure including the risks, benefits and alternatives for the proposed anesthesia with the patient or authorized representative who has indicated his/her understanding and acceptance.     Plan Discussed with:   Anesthesia Plan Comments:         Anesthesia Quick Evaluation

## 2014-08-30 NOTE — Anesthesia Procedure Notes (Signed)
Epidural Patient location during procedure: OB  Staffing Anesthesiologist: Nolon Nations R Performed by: anesthesiologist   Preanesthetic Checklist Completed: patient identified, pre-op evaluation, timeout performed, IV checked, risks and benefits discussed and monitors and equipment checked  Epidural Patient position: sitting Prep: site prepped and draped and DuraPrep Patient monitoring: heart rate Approach: midline Location: L3-L4 Injection technique: LOR air and LOR saline  Needle:  Needle type: Tuohy  Needle gauge: 17 G Needle length: 9 cm Needle insertion depth: 7 cm Catheter type: closed end flexible Catheter size: 19 Gauge Catheter at skin depth: 13 cm Test dose: negative  Assessment Sensory level: T8 Events: blood not aspirated, injection not painful, no injection resistance, negative IV test and no paresthesia  Additional Notes Reason for block:procedure for pain

## 2014-08-31 ENCOUNTER — Encounter (HOSPITAL_COMMUNITY): Payer: Self-pay

## 2014-08-31 LAB — CBC
HEMATOCRIT: 33.5 % — AB (ref 36.0–46.0)
Hemoglobin: 10.8 g/dL — ABNORMAL LOW (ref 12.0–15.0)
MCH: 25.3 pg — AB (ref 26.0–34.0)
MCHC: 32.2 g/dL (ref 30.0–36.0)
MCV: 78.5 fL (ref 78.0–100.0)
Platelets: 181 10*3/uL (ref 150–400)
RBC: 4.27 MIL/uL (ref 3.87–5.11)
RDW: 14.6 % (ref 11.5–15.5)
WBC: 7.4 10*3/uL (ref 4.0–10.5)

## 2014-08-31 MED ORDER — IBUPROFEN 600 MG PO TABS: 600.0000 mg | ORAL_TABLET | Freq: Four times a day (QID) | ORAL | Status: DC

## 2014-08-31 MED ORDER — OXYCODONE-ACETAMINOPHEN 5-325 MG PO TABS: 1.0000 | ORAL_TABLET | ORAL | Status: DC | PRN

## 2014-08-31 NOTE — Progress Notes (Signed)
Clinical Social Work Department BRIEF PSYCHOSOCIAL ASSESSMENT 08/31/2014  Patient:  Anna Ingram, Anna Ingram     Account Number:  [id number]     Admit date:  08/29/2014  Clinical Social Worker:  Lucita Ferrara, CLINICAL SOCIAL WORKER  Date/Time:  08/31/2014 11:00 AM  Referred by:  RN  Date Referred:  08/30/2014 Referred for  Behavioral Health Issues   Other Referral:   Interview type:  Family Other interview type:    PSYCHOSOCIAL DATA Living Status:  FAMILY Admitted from facility:   Level of care:   Primary support name:  Mr. Kapusta Primary support relationship to patient:  SPOUSE Degree of support available:   MOB lives with the FOB.  MOB stated that her mother lives in [place] and will be spending some time in [place] to provide additional support.  The paternal side of the family lives in [place] and is actively involved in providing support.    CURRENT CONCERNS Current Concerns  None Noted   Other Concerns:   MOB presents with history of anxiety.    SOCIAL WORK ASSESSMENT / PLAN CSW met with the MOB in her room in order to complete the assessment. Consult was ordered due to MOB presenting with a history of anxiety.  MOB and FOB were easily engaged and were receptive to the visit.  MOB displayed a full range in affect and presented in a pleasant mood.    MOB expressed excitement upon the birth of their son.  She smiled frequently as she discussed her anticipation to return home.  MOB denied any questions, concerns, or stressors as she transitions into the postpartum period.  MOB and FOB briefly discussed their concerns about how their son Glennon Mac may transition to being a big brother, but they presented with awareness that it will be a transition for him as he adjusts to not being an only child.    CSW inquired about her history of anxiety.  She stated that she feels stressed when she has to fly on an airplane, and she laughed/smile since this fear is documented as anxiety in  her chart. MOB denied any symptoms of anxiety and/or other mood symptoms during her pregnancy.  MOB denied history of postpartum depression, but was receptive to education on postpartum period.   Assessment/plan status:  No Further Intervention Required/No barriers to discharge.  Other assessment/ plan:   CSW provided education on postpartum depression.   Information/referral to community resources:   No referrals needed at this time.    PATIENT'S/FAMILY'S RESPONSE TO PLAN OF CARE: MOB and FOB expressed appreciation for the visit and thanked CSW.

## 2014-08-31 NOTE — Progress Notes (Signed)
PPD 1 SVD  S:  Reports feeling well - desires early DC today             Tolerating po/ No nausea or vomiting             Bleeding is light             Pain controlled with motrin and percocet             Up ad lib / ambulatory / voiding QS  Newborn breast feeding  / Circumcision planned today O:               VS: BP 102/60  Pulse 82  Temp(Src) 98 F (36.7 C) (Oral)  Resp 18  Ht 5\' 7"  (1.702 m)  Wt 92.08 kg (203 lb)  BMI 31.79 kg/m2  SpO2 96%  LMP 11/28/2013  Breastfeeding? Unknown   LABS:              Recent Labs  08/29/14 2028 08/30/14 0808 08/31/14 0615  WBC 5.0  --  7.4  HGB 12.8  --  10.8*  PLT 197 199 181               Blood type: --/--/O POS (10/11 2028)  Rubella: Immune (03/23 0000)                      Physical Exam:             Alert and oriented X3  Abdomen: soft, non-tender, non-distended              Fundus: firm, non-tender, U-1  Perineum: ice pack in place  Lochia: light  Extremities: no edema, no calf pain or tenderness    A: PPD # 1   Doing well - stable status  P: Routine post partum orders  DC early today after newborn circ  Artelia Laroche CNM, MSN, [facility] 08/31/2014, 10:04 AM

## 2014-08-31 NOTE — Discharge Summary (Signed)
Obstetric Discharge Summary  Reason for Admission: induction of labor - Hyperthroidism-stable on meds / umbilical cord cysts Prenatal Procedures: NST and ultrasound Intrapartum Procedures: vacuum assistance vaginal delivery Postpartum Procedures: none Complications-Operative and Postpartum: none Hemoglobin  Date Value Ref Range Status  08/31/2014 10.8* 12.0 - 15.0 g/dL Final     HCT  Date Value Ref Range Status  08/31/2014 33.5* 36.0 - 46.0 % Final   Physical Exam:  General: alert, cooperative and no distress Lochia: appropriate Uterine Fundus: firm Perineum: intact DVT Evaluation: No evidence of DVT seen on physical exam.  Discharge Diagnoses: Term Pregnancy-delivered Hyperthyroidism  Discharge Information: Date: 08/31/2014 Activity: pelvic rest Diet: routine Medications: PNV, Ibuprofen and Percocet, PTU Condition: stable Instructions: refer to practice specific booklet Discharge to: home Follow-up Information   Follow up with Gianny Killman A, MD. Schedule an appointment as soon as possible for a visit in 6 weeks. (see endocrinology as schedueld PP)    Specialty:  Obstetrics and Gynecology   Contact information:   [address] [place] [address] [phone]       Newborn Data: Live born female  Birth Weight: 7 lb 4 oz (3289 g) APGAR: 9, 9 circ done Home with mother.  Artelia Laroche 08/31/2014, 10:06 AM

## 2014-09-20 ENCOUNTER — Encounter (HOSPITAL_COMMUNITY): Payer: Self-pay

## 2015-01-18 ENCOUNTER — Telehealth: Payer: Self-pay | Admitting: Oncology

## 2015-01-18 NOTE — Telephone Encounter (Signed)
PT CONFIRMED APPT 02/17/15 AT 1:30PM W/SHADAD DX: EVALUATION OF LEUKOPENIA REFERRING [facility]

## 2015-01-19 ENCOUNTER — Telehealth: Payer: Self-pay | Admitting: Oncology

## 2015-01-19 NOTE — Telephone Encounter (Signed)
Chart delivered on 01/19/15.  TG

## 2015-02-17 ENCOUNTER — Other Ambulatory Visit: Payer: Self-pay

## 2015-02-17 ENCOUNTER — Ambulatory Visit (HOSPITAL_BASED_OUTPATIENT_CLINIC_OR_DEPARTMENT_OTHER): Payer: BLUE CROSS/BLUE SHIELD | Admitting: Oncology

## 2015-02-17 ENCOUNTER — Telehealth: Payer: Self-pay | Admitting: Oncology

## 2015-02-17 ENCOUNTER — Ambulatory Visit: Payer: BLUE CROSS/BLUE SHIELD

## 2015-02-17 VITALS — BP 104/68 | HR 70 | Temp 98.0°F | Resp 18 | Ht 67.0 in | Wt 173.2 lb

## 2015-02-17 DIAGNOSIS — R799 Abnormal finding of blood chemistry, unspecified: Secondary | ICD-10-CM | POA: Diagnosis not present

## 2015-02-17 DIAGNOSIS — D563 Thalassemia minor: Secondary | ICD-10-CM

## 2015-02-17 DIAGNOSIS — D72819 Decreased white blood cell count, unspecified: Secondary | ICD-10-CM | POA: Diagnosis not present

## 2015-02-17 NOTE — Progress Notes (Signed)
Please se consult note.

## 2015-02-17 NOTE — Telephone Encounter (Signed)
Pt confirmed labs/ov per 03/31 POF, gave pt AVS and Calendar.... KJ  °

## 2015-02-17 NOTE — Consult Note (Signed)
Reason for Referral: Leukocytopenia.   HPI: 39 year old woman currently of [place] where she lived the majority of her life. She is a rather healthy woman with history of thalassemia minor and hypothyroidism. She is noted to have leukocytopenia and her most recent CBC done in February 2016 showed a white cell count of 2.5, hemoglobin of 12.8 and a platelet count of 216. Her MCV is slightly low at 77.0 her lymphocyte percentage was 44.8 upper limit of normal was 43.5. Otherwise the differential is perfectly normal. Previous CBC in 2014 showed a white cell count of 2.1 her hemoglobin was 12.7 with an MCV of 74.9. Her lymphocyte percentage was normal at 43.3. She gave birth to her second child in October 2015 and her white cell count was normal at that time was 7.4. Her first pregnancy was in 2013 and at that time her white cell count was normal at 4.1. She reports that she has been told that she has low white cell count dating back when she was 39 years old. She had HIV testing at that time which was unremarkable. Clinically she is completely asymptomatic. She does not report any headaches, blurry vision, syncope or seizures. She does not report any fevers, chills, sweats, weight loss or appetite changes. She does report occasional fatigue. She does not report any chest pain, palpitation orthopnea. She does not report any cough or hemoptysis or hematemesis. She does not report any nausea, vomiting, abdominal pain. She does not report any hematochezia or melena. She does not report any urinary symptoms. She does not report any petechia or lymphadenopathy. The remaining review of systems unremarkable.   Past Medical History  Diagnosis Date  . Hyperthyroidism   . Fibroids   . Anxiety   . Polyhydramnios   . Thalassemia minor   . Leiomyoma of uterus   . Mucous polyp of cervix   . Other psoriasis   . AMA (advanced maternal age) multigravida 35+   :  Past Surgical History  Procedure Laterality Date  .  Toe surgery    . Eye surgery      lasik  . Tonsillectomy    :   Current outpatient prescriptions:  .  Acetaminophen-Guaifenesin (TYLENOL CHEST CONGESTION PO), Take 2 tablets by mouth daily as needed (for cold symptoms.)., Disp: , Rfl:  .  CAMILA 0.35 MG tablet, Take 1 tablet by mouth daily., Disp: , Rfl: 3 .  cetirizine (ZYRTEC) 10 MG tablet, Take 10 mg by mouth daily as needed for allergies. , Disp: , Rfl:  .  fluocinolone (DERMA-SMOOTHE/FS SCALP) 0.01 % external oil, Apply 1 application topically 2 (two) times a week., Disp: , Rfl:  .  ibuprofen (ADVIL,MOTRIN) 600 MG tablet, Take 1 tablet (600 mg total) by mouth every 6 (six) hours., Disp: 30 tablet, Rfl: 0 .  IRON PO, Take 325 mg by mouth daily., Disp: , Rfl:  .  oxyCODONE-acetaminophen (PERCOCET/ROXICET) 5-325 MG per tablet, Take 1 tablet by mouth every 4 (four) hours as needed (for pain scale less than 7)., Disp: 20 tablet, Rfl: 0 .  Prenatal Vit-Fe Fumarate-FA (PRENATAL MULTIVITAMIN) TABS, Take 1 tablet by mouth at bedtime., Disp: , Rfl:  .  propylthiouracil (PTU) 50 MG tablet, Take 50 mg by mouth at bedtime., Disp: , Rfl: :  No Known Allergies:  Family History  Problem Relation Age of Onset  . Hypertension Mother   . Heart attack Maternal Uncle   . Heart attack Maternal Grandmother   :  History   Social  History  . Marital Status: Married    Spouse Name: N/A  . Number of Children: N/A  . Years of Education: N/A   Occupational History  . Not on file.   Social History Main Topics  . Smoking status: Never Smoker   . Smokeless tobacco: Never Used  . Alcohol Use: Not on file  . Drug Use: No  . Sexual Activity: Yes   Other Topics Concern  . Not on file   Social History Narrative  :  Pertinent items are noted in HPI.  Exam: ECOG 0 Blood pressure 104/68, pulse 70, temperature 98 F (36.7 C), temperature source Oral, resp. rate 18, height 5' 7"  (1.702 m), weight 173 lb 3.2 oz (78.563 kg), SpO2 100 %, unknown if  currently breastfeeding. General appearance: alert and cooperative Throat: lips, mucosa, and tongue normal; teeth and gums normal Neck: no adenopathy Back: negative Resp: clear to auscultation bilaterally Chest wall: no tenderness Cardio: regular rate and rhythm, S1, S2 normal, no murmur, click, rub or gallop GI: soft, non-tender; bowel sounds normal; no masses,  no organomegaly Extremities: extremities normal, atraumatic, no cyanosis or edema Pulses: 2+ and symmetric Skin: Skin color, texture, turgor normal. No rashes or lesions Lymph nodes: Cervical, supraclavicular, and axillary nodes normal.  CBC    Component Value Date/Time   WBC 7.4 08/31/2014 0615   RBC 4.27 08/31/2014 0615   HGB 10.8* 08/31/2014 0615   HCT 33.5* 08/31/2014 0615   PLT 181 08/31/2014 0615   MCV 78.5 08/31/2014 0615   MCH 25.3* 08/31/2014 0615   MCHC 32.2 08/31/2014 0615   RDW 14.6 08/31/2014 0615      Assessment and Plan:   39 year old woman with the following issues:  1. 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 Per her report she had been told that she has leukocytopenia dating back 20 years ago. The differential diagnosis was discussed today with the patient. This would include benign fluctuating request cytopenia due to a slight variation, autoimmune disorder, viral infection and less likely hematological disorder. Lymphoproliferative disorders or myelodysplastic syndrome are unlikely.  For management standpoint I have recommended continued observation and surveillance. I will repeat her CBC in 6 months to ensure consistency with her fluctuating white cell count. If her white cell count continue be fluctuating within normal range or close to it, no further hematological workup as needed. I see no need for imaging studies or a bone marrow biopsy.  2. Microcytosis: She has an element of iron deficiency but also thalassemia. I have recommended repeating her  iron studies in the next visit and supplement her with oral iron if needed to.  For questions were answered today to her satisfaction.

## 2015-08-19 ENCOUNTER — Ambulatory Visit (HOSPITAL_BASED_OUTPATIENT_CLINIC_OR_DEPARTMENT_OTHER): Payer: BLUE CROSS/BLUE SHIELD | Admitting: Oncology

## 2015-08-19 ENCOUNTER — Other Ambulatory Visit (HOSPITAL_BASED_OUTPATIENT_CLINIC_OR_DEPARTMENT_OTHER): Payer: BLUE CROSS/BLUE SHIELD

## 2015-08-19 ENCOUNTER — Telehealth: Payer: Self-pay | Admitting: Oncology

## 2015-08-19 VITALS — BP 97/58 | HR 76 | Temp 98.2°F | Resp 18 | Ht 67.0 in | Wt 168.7 lb

## 2015-08-19 DIAGNOSIS — D72819 Decreased white blood cell count, unspecified: Secondary | ICD-10-CM | POA: Diagnosis not present

## 2015-08-19 DIAGNOSIS — D569 Thalassemia, unspecified: Secondary | ICD-10-CM

## 2015-08-19 DIAGNOSIS — D563 Thalassemia minor: Secondary | ICD-10-CM

## 2015-08-19 DIAGNOSIS — R799 Abnormal finding of blood chemistry, unspecified: Secondary | ICD-10-CM

## 2015-08-19 DIAGNOSIS — E611 Iron deficiency: Secondary | ICD-10-CM

## 2015-08-19 DIAGNOSIS — E0590 Thyrotoxicosis, unspecified without thyrotoxic crisis or storm: Secondary | ICD-10-CM

## 2015-08-19 DIAGNOSIS — O99283 Endocrine, nutritional and metabolic diseases complicating pregnancy, third trimester: Secondary | ICD-10-CM

## 2015-08-19 LAB — CBC WITH DIFFERENTIAL/PLATELET
BASO%: 0.8 % (ref 0.0–2.0)
Basophils Absolute: 0 10*3/uL (ref 0.0–0.1)
EOS%: 0.6 % (ref 0.0–7.0)
Eosinophils Absolute: 0 10*3/uL (ref 0.0–0.5)
HCT: 41.1 % (ref 34.8–46.6)
HGB: 12.7 g/dL (ref 11.6–15.9)
LYMPH%: 42.8 % (ref 14.0–49.7)
MCH: 23.5 pg — ABNORMAL LOW (ref 25.1–34.0)
MCHC: 30.9 g/dL — ABNORMAL LOW (ref 31.5–36.0)
MCV: 76 fL — ABNORMAL LOW (ref 79.5–101.0)
MONO#: 0.2 10*3/uL (ref 0.1–0.9)
MONO%: 11.3 % (ref 0.0–14.0)
NEUT%: 44.5 % (ref 38.4–76.8)
NEUTROS ABS: 0.9 10*3/uL — AB (ref 1.5–6.5)
Platelets: 209 10*3/uL (ref 145–400)
RBC: 5.4 10*6/uL (ref 3.70–5.45)
RDW: 14 % (ref 11.2–14.5)
WBC: 2.1 10*3/uL — AB (ref 3.9–10.3)
lymph#: 0.9 10*3/uL (ref 0.9–3.3)

## 2015-08-19 LAB — IRON AND TIBC CHCC
%SAT: 30 % (ref 21–57)
IRON: 84 ug/dL (ref 41–142)
TIBC: 278 ug/dL (ref 236–444)
UIBC: 193 ug/dL (ref 120–384)

## 2015-08-19 LAB — FERRITIN CHCC: FERRITIN: 34 ng/mL (ref 9–269)

## 2015-08-19 LAB — CHCC SMEAR

## 2015-08-19 NOTE — Telephone Encounter (Signed)
per pof to sch pt appt-gave pt copy of avs °

## 2015-08-19 NOTE — Progress Notes (Signed)
Hematology and Oncology Follow Up Visit  Anna Ingram [id number] Jun 12, 1976 39 y.o. 08/19/2015 1:40 PM No PCP Per PatientNo ref. provider found   Principle Diagnosis: 39 year old woman with leukocytopenia and iron deficiency anemia documented in March 2016. She has an element of thalassemia as well.   Current therapy: She is currently on prenatal vitamins to supplement her iron stores.  Interim History: Anna Ingram presents today for a follow-up visit. Since the last visit, she reports no complaints. She has not reported any recurrent infections or hospitalizations. Has not reported any constitutional symptoms of fevers or chills or palpable adenopathy. She continues to perform activities of daily living without any decline. She has been taking prenatal vitamins without any issues. She also takes PTU for her thyroid disease.   She does not report any headaches, blurry vision, syncope or seizures. She does not report any fevers, chills, sweats, weight loss or appetite changes. She does report occasional fatigue. She does not report any chest pain, palpitation orthopnea. She does not report any cough or hemoptysis or hematemesis. She does not report any nausea, vomiting, abdominal pain. She does not report any hematochezia or melena. She does not report any urinary symptoms. She does not report any petechia or lymphadenopathy. The remaining review of systems unremarkable.    Medications: I have reviewed the patient's current medications.  Current Outpatient Prescriptions  Medication Sig Dispense Refill  . Acetaminophen-Guaifenesin (TYLENOL CHEST CONGESTION PO) Take 2 tablets by mouth daily as needed (for cold symptoms.).    Marland Kitchen CAMILA 0.35 MG tablet Take 1 tablet by mouth daily.  3  . cetirizine (ZYRTEC) 10 MG tablet Take 10 mg by mouth daily as needed for allergies.     . fluocinolone (DERMA-SMOOTHE/FS SCALP) 0.01 % external oil Apply 1 application topically 2 (two) times a week.    Marland Kitchen  ibuprofen (ADVIL,MOTRIN) 600 MG tablet Take 1 tablet (600 mg total) by mouth every 6 (six) hours. 30 tablet 0  . IRON PO Take 325 mg by mouth daily.    Marland Kitchen oxyCODONE-acetaminophen (PERCOCET/ROXICET) 5-325 MG per tablet Take 1 tablet by mouth every 4 (four) hours as needed (for pain scale less than 7). 20 tablet 0  . Prenatal Vit-Fe Fumarate-FA (PRENATAL MULTIVITAMIN) TABS Take 1 tablet by mouth at bedtime.    . propylthiouracil (PTU) 50 MG tablet Take 50 mg by mouth at bedtime.     No current facility-administered medications for this visit.     Allergies: No Known Allergies  Past Medical History, Surgical history, Social history, and Family History were reviewed and updated.   Physical Exam: Blood pressure 97/58, pulse 76, temperature 98.2 F (36.8 C), temperature source Oral, resp. rate 18, height 5' 7"  (1.702 m), weight 168 lb 11.2 oz (76.522 kg), SpO2 100 %, unknown if currently breastfeeding. ECOG: 0 General appearance: alert and cooperative Head: Normocephalic, without obvious abnormality Neck: no adenopathy Lymph nodes: Cervical, supraclavicular, and axillary nodes normal. Heart:regular rate and rhythm, S1, S2 normal, no murmur, click, rub or gallop Lung:chest clear, no wheezing, rales, normal symmetric air entry Abdomin: soft, non-tender, without masses or organomegaly EXT:no erythema, induration, or nodules   Lab Results: Lab Results  Component Value Date   WBC 2.1* 08/19/2015   HGB 12.7 08/19/2015   HCT 41.1 08/19/2015   MCV 76.0* 08/19/2015   PLT 209 08/19/2015     Chemistry   No results found for: NA, K, CL, CO2, BUN, CREATININE, GLU No results found for: CALCIUM, ALKPHOS, AST, ALT, BILITOT  Impression and Plan:  39 year old woman with the following issues:  1. 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 Per her report she had been told that she has leukocytopenia dating back 20 years ago. The differential  diagnosis includes normal benign variation, PTU related and reactive findings. I doubt this is a hematological disorder.  For management standpoint I have recommended continued observation and surveillance. I will repeat her CBC in 6 months to ensure consistency with her fluctuating white cell count. If her white cell count continue be fluctuating within normal range or close to it, no further hematological workup as needed. I see no need for imaging studies or a bone marrow biopsy. If she develops worsening cytopenias, we will consider bone marrow biopsy at that time.  2. Microcytosis: She has an element of iron deficiency but also thalassemia. She is currently taking prenatal vitamins and her hemoglobin has corrected. We will continue to monitor her iron studies periodically.     Zola Button, MD 9/30/20161:40 PM

## 2016-02-17 ENCOUNTER — Ambulatory Visit (HOSPITAL_BASED_OUTPATIENT_CLINIC_OR_DEPARTMENT_OTHER): Payer: BLUE CROSS/BLUE SHIELD | Admitting: Oncology

## 2016-02-17 ENCOUNTER — Other Ambulatory Visit (HOSPITAL_BASED_OUTPATIENT_CLINIC_OR_DEPARTMENT_OTHER): Payer: BLUE CROSS/BLUE SHIELD

## 2016-02-17 VITALS — BP 99/54 | HR 74 | Temp 98.0°F | Resp 20 | Ht 67.0 in | Wt 177.7 lb

## 2016-02-17 DIAGNOSIS — D509 Iron deficiency anemia, unspecified: Secondary | ICD-10-CM

## 2016-02-17 DIAGNOSIS — D563 Thalassemia minor: Secondary | ICD-10-CM

## 2016-02-17 DIAGNOSIS — O99283 Endocrine, nutritional and metabolic diseases complicating pregnancy, third trimester: Secondary | ICD-10-CM

## 2016-02-17 DIAGNOSIS — D72819 Decreased white blood cell count, unspecified: Secondary | ICD-10-CM

## 2016-02-17 DIAGNOSIS — E0590 Thyrotoxicosis, unspecified without thyrotoxic crisis or storm: Secondary | ICD-10-CM

## 2016-02-17 LAB — CBC WITH DIFFERENTIAL/PLATELET
BASO%: 1 % (ref 0.0–2.0)
Basophils Absolute: 0 10*3/uL (ref 0.0–0.1)
EOS%: 1.5 % (ref 0.0–7.0)
Eosinophils Absolute: 0 10*3/uL (ref 0.0–0.5)
HCT: 40.8 % (ref 34.8–46.6)
HGB: 12.4 g/dL (ref 11.6–15.9)
LYMPH%: 36.9 % (ref 14.0–49.7)
MCH: 23.4 pg — ABNORMAL LOW (ref 25.1–34.0)
MCHC: 30.4 g/dL — ABNORMAL LOW (ref 31.5–36.0)
MCV: 76.9 fL — ABNORMAL LOW (ref 79.5–101.0)
MONO#: 0.2 10*3/uL (ref 0.1–0.9)
MONO%: 8 % (ref 0.0–14.0)
NEUT#: 1.3 10*3/uL — ABNORMAL LOW (ref 1.5–6.5)
NEUT%: 52.6 % (ref 38.4–76.8)
Platelets: 229 10*3/uL (ref 145–400)
RBC: 5.3 10*6/uL (ref 3.70–5.45)
RDW: 14.4 % (ref 11.2–14.5)
WBC: 2.5 10*3/uL — ABNORMAL LOW (ref 3.9–10.3)
lymph#: 0.9 10*3/uL (ref 0.9–3.3)

## 2016-02-17 LAB — CHCC SMEAR

## 2016-02-17 NOTE — Progress Notes (Signed)
Hematology and Oncology Follow Up Visit  Anna Ingram [id number] March 14, 1976 40 y.o. 02/17/2016 3:33 PM No PCP Per PatientNo ref. provider found   Principle Diagnosis: 40 year old woman with leukocytopenia and iron deficiency anemia documented in March 2016. She has an element of thalassemia as well.   Current therapy: She is currently on prenatal vitamins to supplement her iron stores.  Interim History: Anna Ingram presents today for a follow-up visit. Since the last visit, she continues to do very well without any changes in her health. She continues on prenatal vitamins which have helped maintained her iron stores adequately. She does not report any symptoms of excessive fatigue, tiredness or bleeding. Has not reported any constitutional symptoms of fevers or chills or palpable adenopathy. She continues to perform activities of daily living without any decline. She continues to take PTU for her thyroid disease.   She does not report any headaches, blurry vision, syncope or seizures. She does not report any fevers, chills, sweats, weight loss or appetite changes. She does report occasional fatigue. She does not report any chest pain, palpitation orthopnea. She does not report any cough or hemoptysis or hematemesis. She does not report any nausea, vomiting, abdominal pain. She does not report any hematochezia or melena. She does not report any urinary symptoms. She does not report any petechia or lymphadenopathy. The remaining review of systems unremarkable.    Medications: I have reviewed the patient's current medications.  Current Outpatient Prescriptions  Medication Sig Dispense Refill  . Acetaminophen-Guaifenesin (TYLENOL CHEST CONGESTION PO) Take 2 tablets by mouth daily as needed (for cold symptoms.).    Marland Kitchen CAMILA 0.35 MG tablet Take 1 tablet by mouth daily.  3  . cetirizine (ZYRTEC) 10 MG tablet Take 10 mg by mouth daily as needed for allergies.     . fluocinolone (DERMA-SMOOTHE/FS  SCALP) 0.01 % external oil Apply 1 application topically 2 (two) times a week.    Marland Kitchen ibuprofen (ADVIL,MOTRIN) 600 MG tablet Take 1 tablet (600 mg total) by mouth every 6 (six) hours. 30 tablet 0  . IRON PO Take 325 mg by mouth daily.    . Prenatal Vit-Fe Fumarate-FA (PRENATAL MULTIVITAMIN) TABS Take 1 tablet by mouth at bedtime.    . propylthiouracil (PTU) 50 MG tablet Take 50 mg by mouth at bedtime.     No current facility-administered medications for this visit.     Allergies: No Known Allergies  Past Medical History, Surgical history, Social history, and Family History were reviewed and updated.   Physical Exam: Blood pressure 99/54, pulse 74, temperature 98 F (36.7 C), temperature source Oral, resp. rate 20, height 5\' 7"  (1.702 m), weight 177 lb 11.2 oz (80.604 kg), SpO2 100 %, unknown if currently breastfeeding. ECOG: 0 General appearance: alert and cooperative appeared without distress. Head: Normocephalic, without obvious abnormality no oral ulcers or lesions. Neck: no adenopathy Lymph nodes: Cervical, supraclavicular, and axillary nodes normal. Heart:regular rate and rhythm, S1, S2 normal, no murmur, click, rub or gallop Lung:chest clear, no wheezing, rales, normal symmetric air entry Abdomin: soft, non-tender, without masses or organomegaly loss or ascites. EXT:no erythema, induration, or nodules   Lab Results: Lab Results  Component Value Date   WBC 2.5* 02/17/2016   HGB 12.4 02/17/2016   HCT 40.8 02/17/2016   MCV 76.9* 02/17/2016   PLT 229 02/17/2016     Chemistry   No results found for: NA, K, CL, CO2, BUN, CREATININE, GLU No results found for: CALCIUM, ALKPHOS, AST, ALT, BILITOT  Impression and Plan:  40 year old woman with the following issues:  1. 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 Per her report she had been told that she has leukocytopenia dating back 20 years ago. The differential diagnosis  includes normal benign variation, PTU related and reactive findings. I doubt this is a hematological disorder.  Her white cell count continues to be within her baseline with normal absolute neutrophil count. This is likely related to S/P variation versus PTU therapy. No further management is needed at this time. I have recommended continue periodic his CBC about once a year to be done with her primary care physician. I see no need for further workup or investigation.   2. Microcytosis: She has an element of iron deficiency but also thalassemia. She is currently taking prenatal vitamins and her hemoglobin has corrected. I recommended continuing prenatal vitamins at this time.  3. Follow-up: I'll be happy to see her in the future as needed.     Zola Button, MD 3/31/20173:33 PM

## 2016-02-20 LAB — IRON AND TIBC
%SAT: 45 % (ref 21–57)
Iron: 116 ug/dL (ref 41–142)
TIBC: 261 ug/dL (ref 236–444)
UIBC: 145 ug/dL (ref 120–384)

## 2016-02-20 LAB — FERRITIN: Ferritin: 34 ng/ml (ref 9–269)

## 2016-02-27 DIAGNOSIS — L218 Other seborrheic dermatitis: Secondary | ICD-10-CM | POA: Diagnosis not present

## 2016-02-27 DIAGNOSIS — L84 Corns and callosities: Secondary | ICD-10-CM | POA: Diagnosis not present

## 2016-03-16 IMAGING — US US OB DETAIL+14 WK
1 series · 16 of 28 positions shown · non-contrast
Comparison: none

[Series 1: us ob detail+14 wk · 0.23mm/px · 16 of 106 slices shown]
[im 1/106]
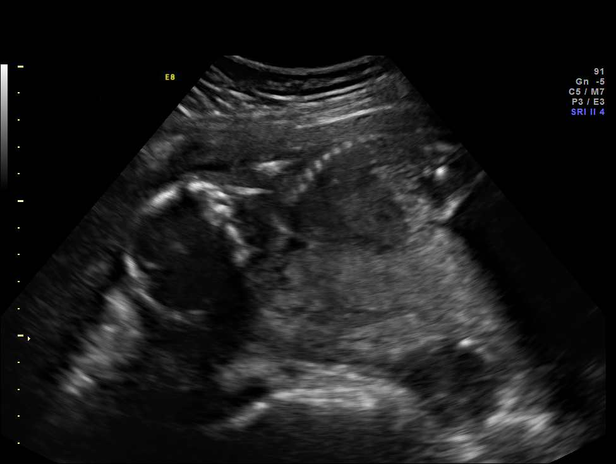
[im 8/106]
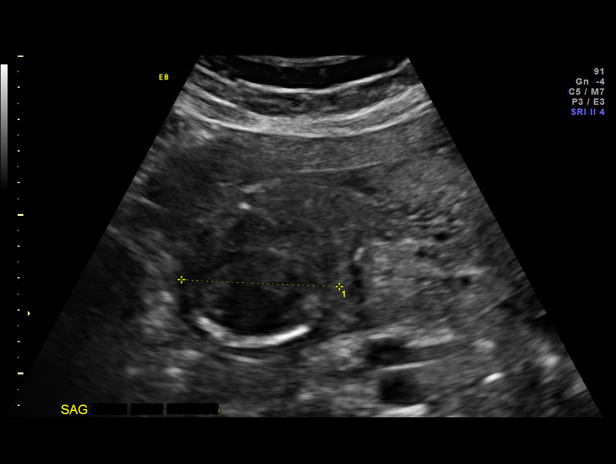
[im 16/106]
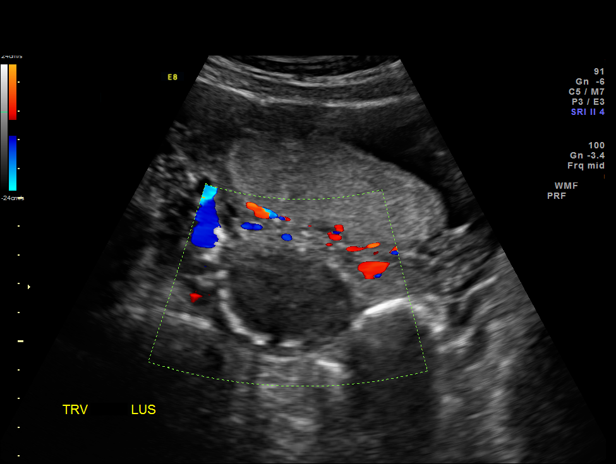
[im 24/106]
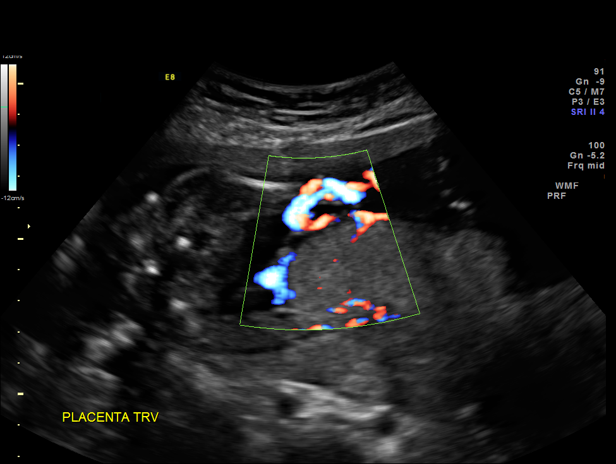
[im 28/106]
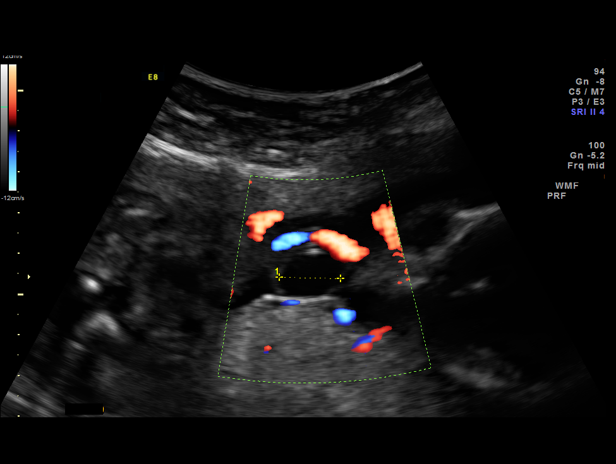
[im 36/106]
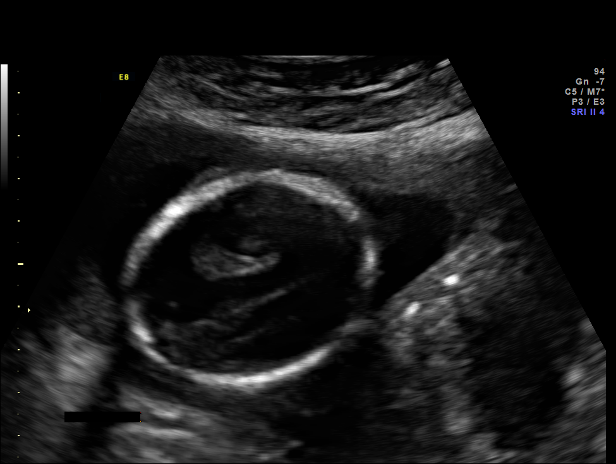
[im 43/106]
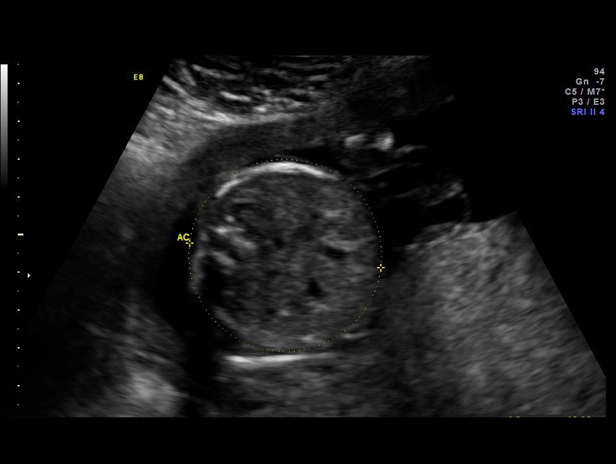
[im 51/106]
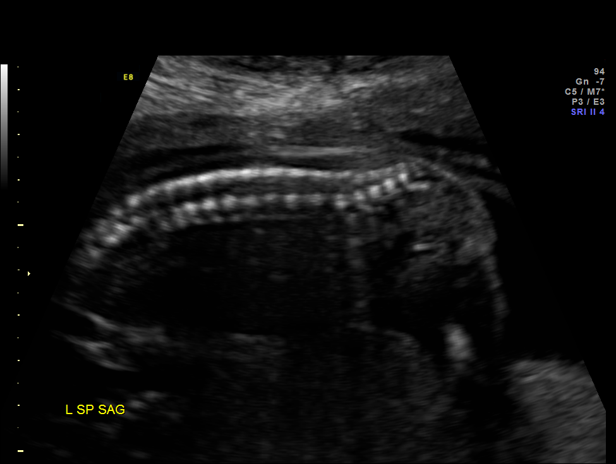
[im 55/106]
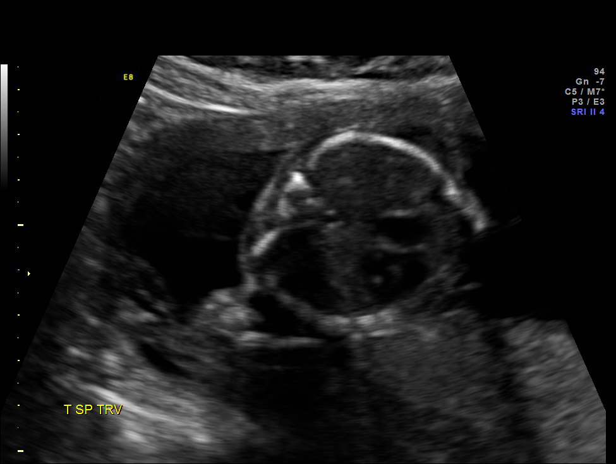
[im 63/106]
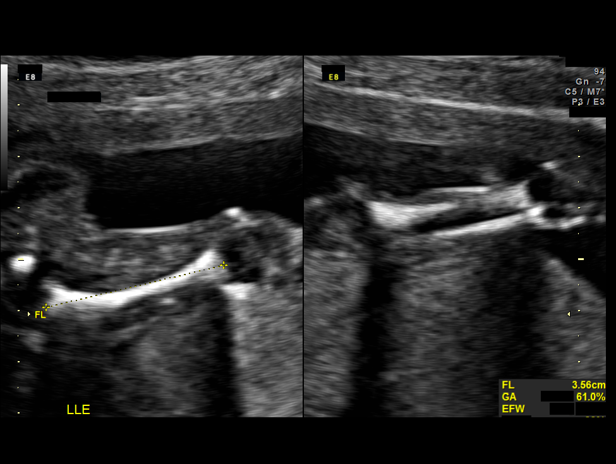
[im 71/106]
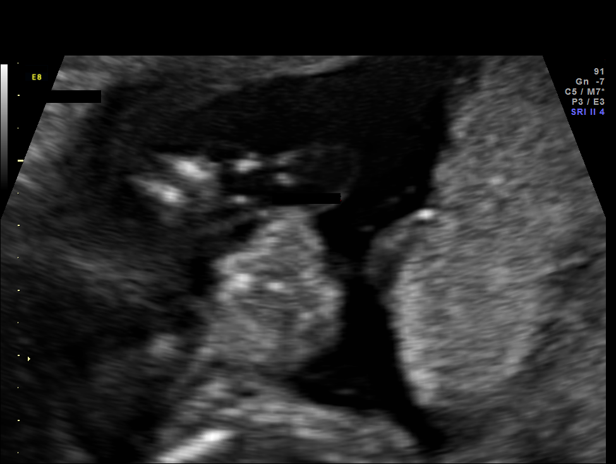
[im 78/106]
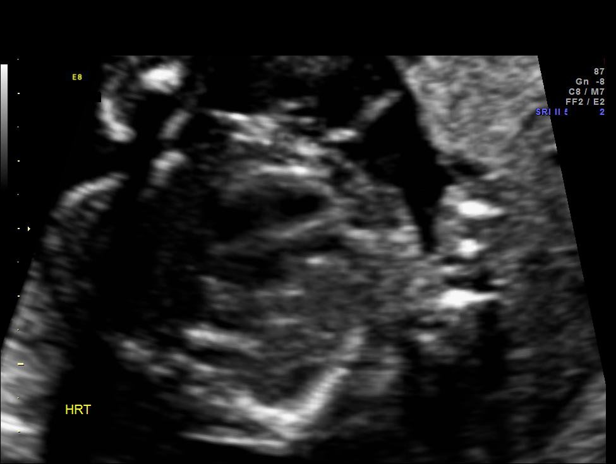
[im 82/106]
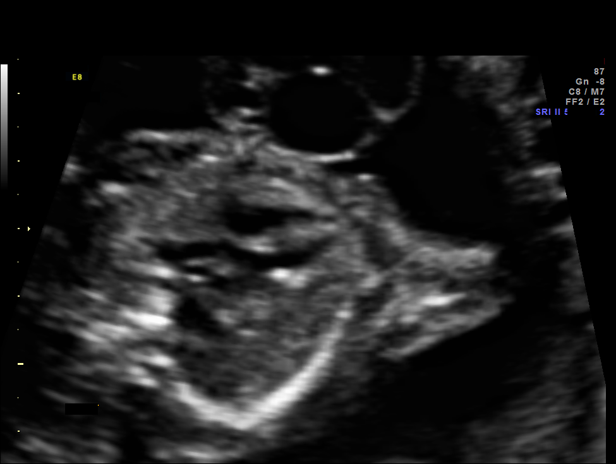
[im 90/106]
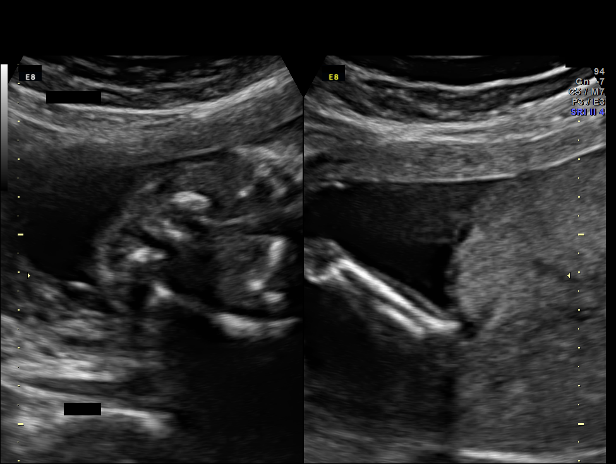
[im 98/106]
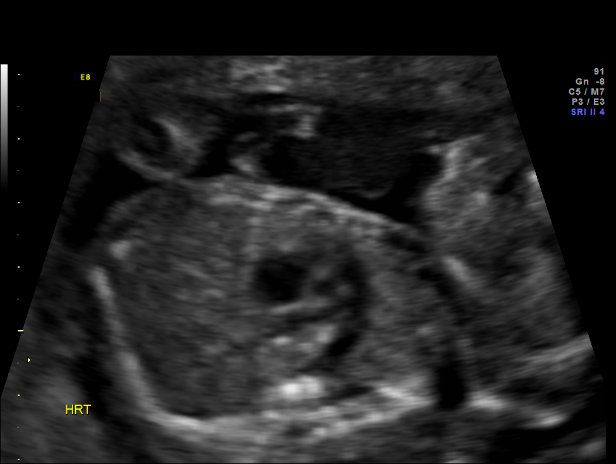
[im 106/106]
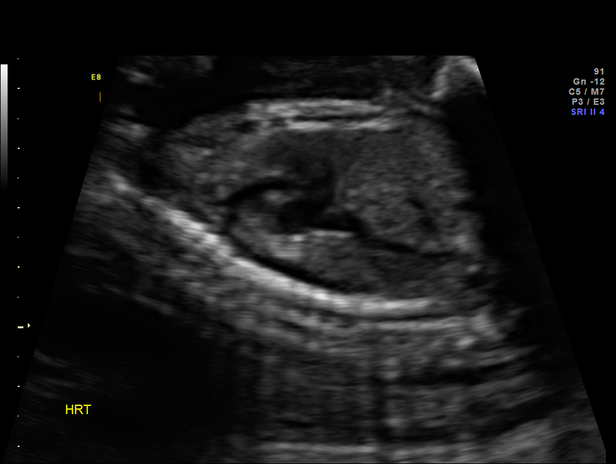

[16 of 28 positions shown; findings below may reference images not displayed]

OBSTETRICS REPORT
                      (Signed Final 04/22/2014 [DATE])

Service(s) Provided

 US OB DETAIL + 14 WK                                  76811.0
Indications

 Echogenic intracardiac focus of the heart  (EIF)
 Placental abnormality: Umbilical cord cyst
 Advanced maternal age (AMA), Multigravida -
 Negative first trimester screen
 Hyperthyroid
 Uterine fibroids
 Detailed fetal anatomic survey
Fetal Evaluation

 Num Of Fetuses:    1
 Fetal Heart Rate:  161                          bpm
 Cardiac Activity:  Observed
 Presentation:      Breech
 Placenta:          Posterior marginal
                    previa
 P. Cord            Visualized, central
 Insertion:

 Amniotic Fluid
 AFI FV:      Subjectively within normal limits
                                             Larg Pckt:     5.9  cm
Biometry

 BPD:     47.1  mm     G. Age:  20w 2d                CI:         72.1   70 - 86
 OFD:     65.3  mm                                    FL/HC:      19.8   15.9 -

 HC:     180.9  mm     G. Age:  20w 3d       32  %    HC/AC:      1.14   1.06 -

 AC:     159.1  mm     G. Age:  21w 0d       54  %    FL/BPD:
 FL:      35.8  mm     G. Age:  21w 2d       63  %    FL/AC:      22.5   20 - 24
 HUM:     35.1  mm     G. Age:  22w 0d       83  %
 CER:     20.9  mm     G. Age:  19w 6d       32  %

 Est. FW:     397  gm    0 lb 14 oz      52  %
Gestational Age

 LMP:           20w 5d        Date:  11/28/13                 EDD:   09/04/14
 U/S Today:     20w 5d                                        EDD:   09/04/14
 Best:          20w 5d     Det. By:  LMP  (11/28/13)          EDD:   09/04/14
Anatomy

 Cranium:          Appears normal         Aortic Arch:      Appears normal
 Fetal Cavum:      Appears normal         Ductal Arch:      Appears normal
 Ventricles:       Appears normal         Diaphragm:        Appears normal
 Choroid Plexus:   Appears normal         Stomach:          Appears normal, left
                                                            sided
 Cerebellum:       Appears normal         Abdomen:          Appears normal
 Posterior Fossa:  Appears normal         Abdominal Wall:   Appears nml (cord
                                                            insert, abd wall)
 Nuchal Fold:      Not applicable (>20    Cord Vessels:     3VC, Umbilical
                   wks GA)
                                                            cord cyst
 Face:             Appears normal         Kidneys:          Appear normal
                   (orbits and profile)
 Lips:             Appears normal         Bladder:          Appears normal
 Palate:           Appears normal         Spine:            Appears normal
 Heart:            Echogenic focus        Lower             Appears normal
                   in LV
                                          Extremities:
 RVOT:             Appears normal         Upper             Appears normal
                                          Extremities:
 LVOT:             Appears normal

 Other:  Fetus appears to be a male. Heels and 5th digit visualized. Nasal
         bone visualized.
Targeted Anatomy

 Fetal Central Nervous System
 Cisterna Magna:
Cervix Uterus Adnexa

 Cervical Length:    3.2      cm

 Cervix:       Normal appearance by transabdominal scan.
 Left Ovary:    Within normal limits.
 Right Ovary:   Not visualized.

 Adnexa:     No abnormality visualized.
Myomas

 Site                     L(cm)      W(cm)       D(cm)      Location
 Posterior LT fundus      5
 Anterior                 3.1        3           2
 Posterior LUS

 Blood Flow                  RI       PI       Comments

Comments

 The patient's fetal anatomic survey is now complete.  An
 echogenic intracardiac focus and an umbilical cord cyst is
 noted on today's ultrasound.  No other fetal anomalies or soft
 markers of aneuploidy were seen.  Both of these findings are
 associated with aneuploidy.  Thus, the risk of aneuploidy is
 increased approximately 10 fold.  Ms. Protus had a first
 trimester screen with a Downs syndrome risk of 1 in 4,161
 and a Trsiomy 13/18 risk of 1 in [DATE].  Therefore her
 adjusted risks are 1 in 416 and 1 in 758 respectively.  I
 discussed invasive and non-invasive testing options in detail
 with Ms. Protus.  She does not wish to have any further
 testing at this time.
Impression

 Single living intrauterine pregnancy at 20 weeks 5 days.
 Appropriate fetal growth (52%).
 Normal amniotic fluid volume.
 Normal fetal anatomy.
 No fetal anomalies or soft markers of aneuploidy seen.
Recommendations

 Recommend follow-up ultrasound examination in 4 weeks to
 reassess umbilical cord cysts and fetal growth.

                Kizer, Mai

## 2016-05-25 IMAGING — US US OB FOLLOW-UP
1 series · 12 of 28 positions shown · non-contrast
Comparison: none

[Series 1: us ob follow-up · 12 of 47 slices shown]
[im 2/47]
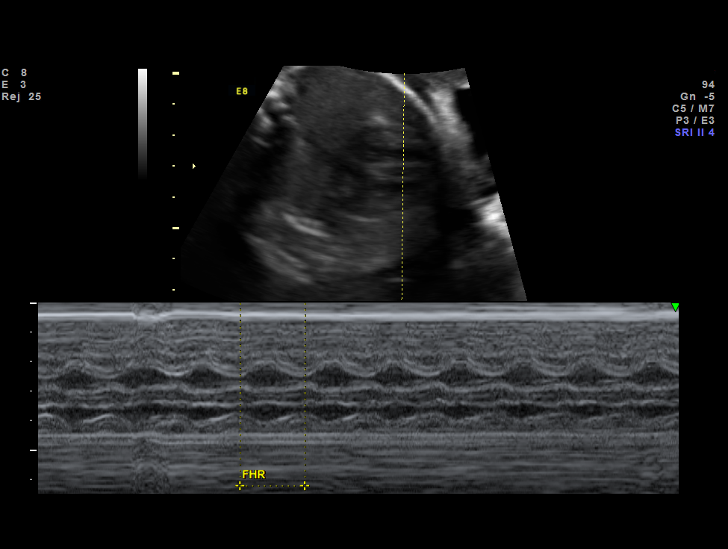
[im 6/47]
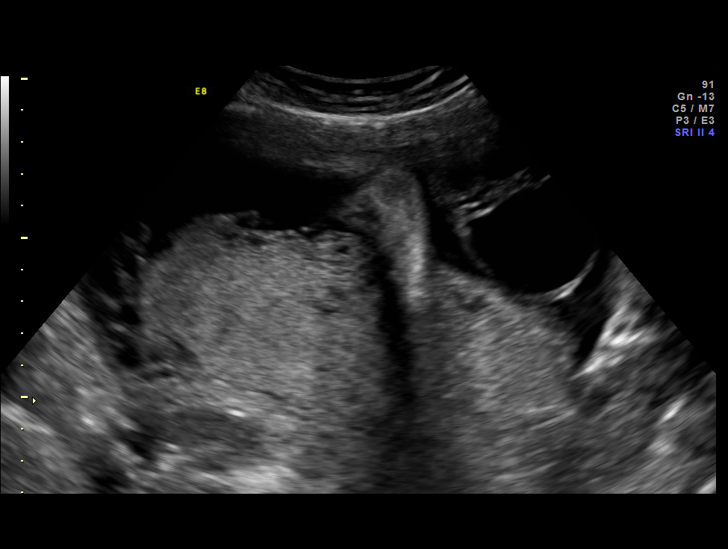
[im 9/47]
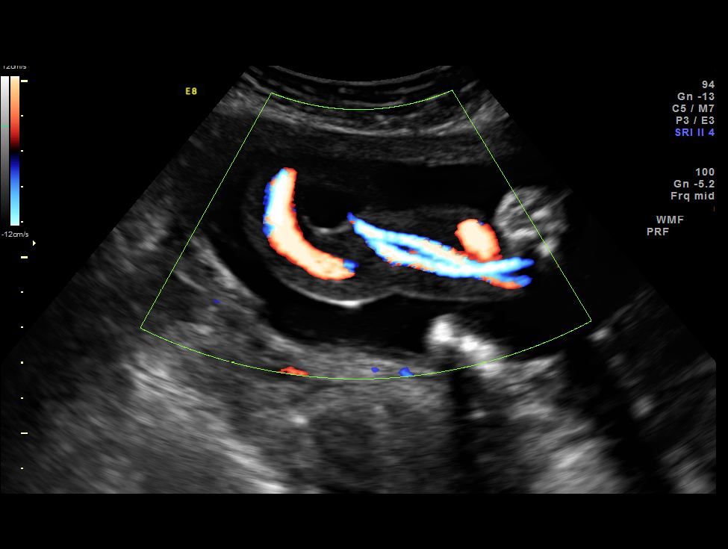
[im 14/47]
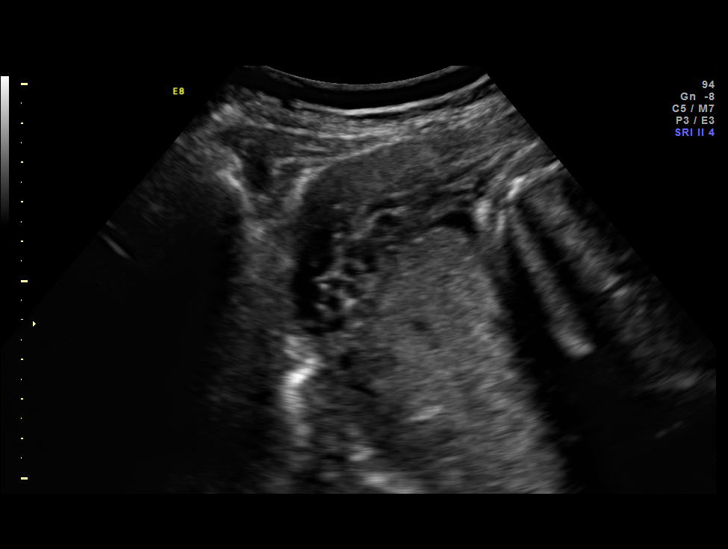
[im 18/47]
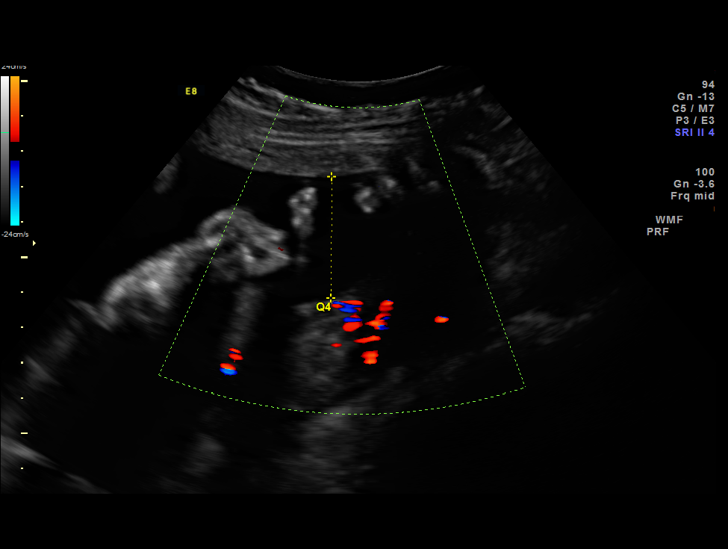
[im 21/47]
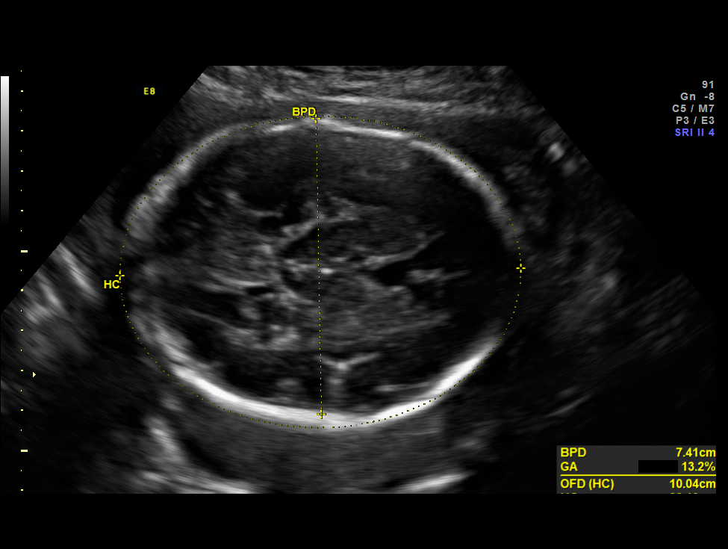
[im 26/47]
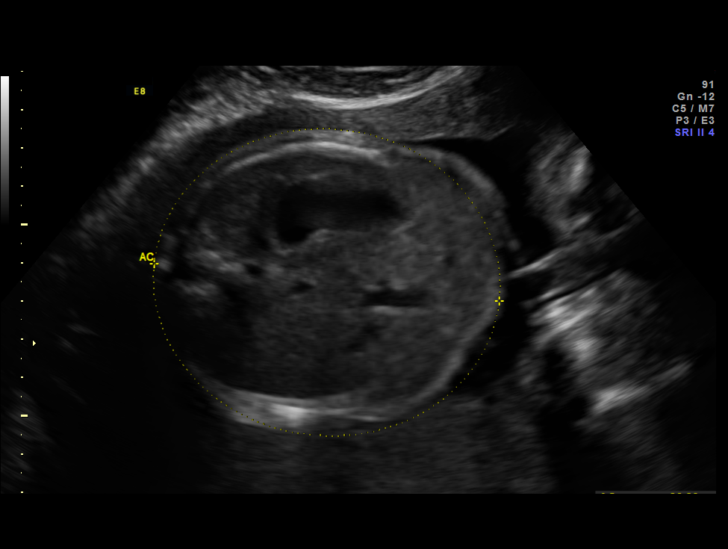
[im 29/47]
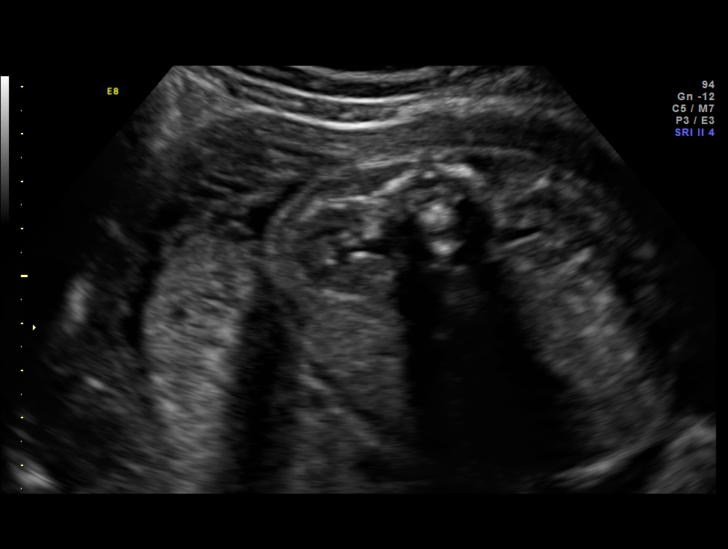
[im 33/47]
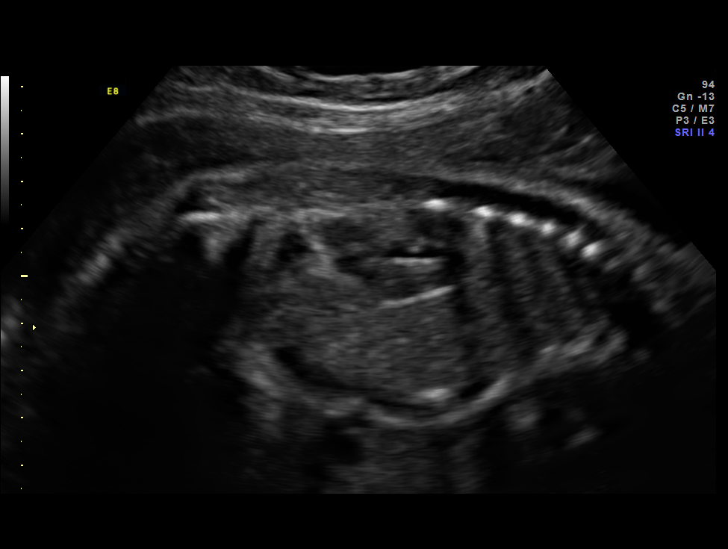
[im 38/47]
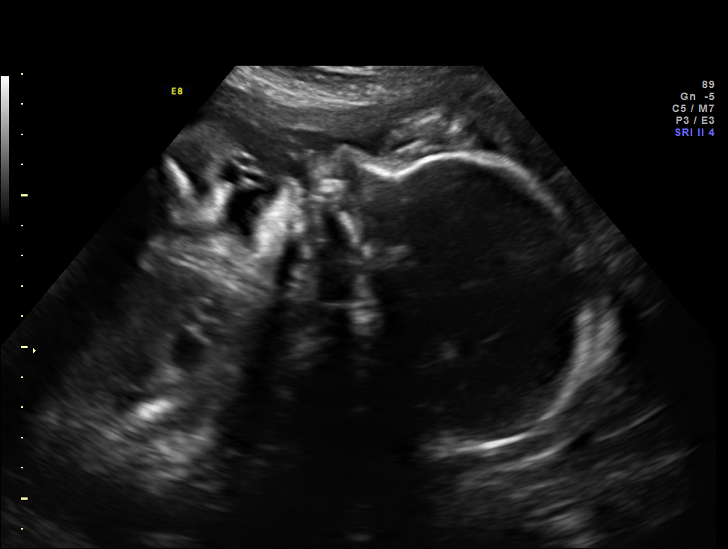
[im 41/47]
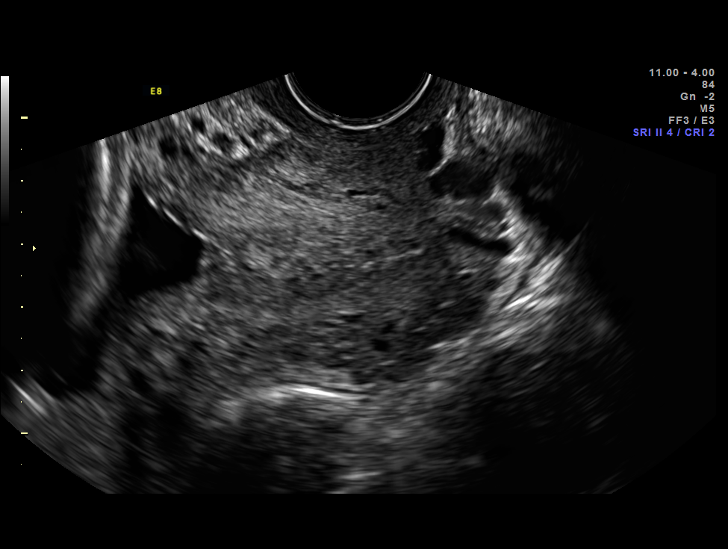
[im 45/47]
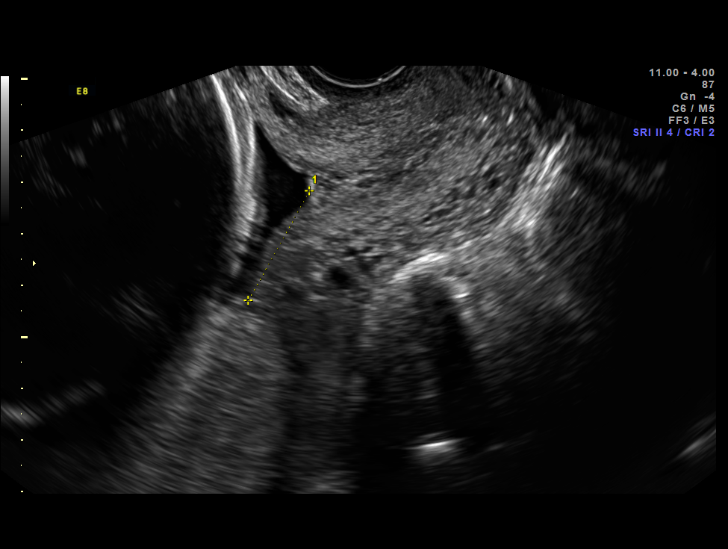

[12 of 28 positions shown; findings below may reference images not displayed]

OBSTETRICS REPORT
                      (Signed Final 07/01/2014 [DATE])

Service(s) Provided

 US OB FOLLOW UP                                       76816.1
 US MFM OB TRANSVAGINAL                                76817.2
Indications

 Placenta previa/Low lying: No bleeding
 Pyelectasis of fetus on prenatal ultrasound -         655.83 593.89,
 resolved
 Placental abnormality: Umbilical cord cyst
 Advanced maternal age (38), Multigravida -
 Negative first trimester screen
 Hyperthyroid on PTU
 Uterine fibroids
Fetal Evaluation

 Num Of Fetuses:    1
 Fetal Heart Rate:  155                          bpm
 Cardiac Activity:  Observed
 Presentation:      Cephalic
 Placenta:          Posterior, above cervical
                    os
 P. Cord            Previously Visualized
 Insertion:

 Amniotic Fluid
 AFI FV:      Subjectively within normal limits
 AFI Sum:     10.04   cm       15  %Tile     Larg Pckt:    3.45  cm
 RUQ:   2.6     cm   RLQ:    3.45   cm    LUQ:   0.82    cm   LLQ:    3.17   cm
Biometry

 BPD:     73.6  mm     G. Age:  29w 4d                CI:         73.1   70 - 86
 OFD:    100.7  mm                                    FL/HC:      20.9   19.3 -

 HC:     280.6  mm     G. Age:  30w 5d       17  %    HC/AC:      1.06   0.96 -

 AC:     265.1  mm     G. Age:  30w 4d       44  %    FL/BPD:     79.8   71 - 87
 FL:      58.7  mm     G. Age:  30w 5d       34  %    FL/AC:      22.1   20 - 24
 HUM:     53.9  mm     G. Age:  31w 2d       66  %

 Est. FW:    2142  gm      3 lb 8 oz     52  %
Gestational Age
 LMP:           30w 5d        Date:  11/28/13                 EDD:   09/04/14
 U/S Today:     30w 3d                                        EDD:   09/06/14
 Best:          30w 5d     Det. By:  LMP  (11/28/13)          EDD:   09/04/14
Anatomy

 Cranium:          Appears normal         Aortic Arch:      Previously seen
 Fetal Cavum:      Appears normal         Ductal Arch:      Previously seen
 Ventricles:       Appears normal         Diaphragm:        Previously seen
 Choroid Plexus:   Previously seen        Stomach:          Appears normal, left
                                                            sided
 Cerebellum:       Previously seen        Abdomen:          Appears normal
 Posterior Fossa:  Previously seen        Abdominal Wall:   Previously seen
 Nuchal Fold:      Not applicable (>20    Cord Vessels:     3VC, Umbilical
                   wks GA)
                                                            cord cysts
 Face:             Orbits and profile     Kidneys:          Appear normal
                   previously seen
 Lips:             Previously seen        Bladder:          Appears normal
 Palate:           Previously seen        Spine:            Previously seen
 Heart:            Appears normal         Lower             Previously seen
                   (4CH, axis, and        Extremities:
                   situs)
 RVOT:             Previously seen        Upper             Previously seen
                                          Extremities:
 LVOT:             Previously seen

 Other:  Male gender previously seen. Heels and 5th digit previously seen.
         Umbilical cord cysts 3.9 x 3.4 x 3.8 cm and 2.6 x 2.0 x 1.6 cm.
Cervix Uterus Adnexa

 Cervical Length:    3.4      cm

 Cervix:       Measured transvaginally. Appears closed, without
               funnelling.

 Adnexa:     No abnormality visualized.
Myomas

 Site                     L(cm)      W(cm)       D(cm)      Location
 Posterior LT fundus
 Anterior                 3.1        3           2
 Posterior LUS

 Blood Flow                  RI       PI       Comments
                                               Previously seen
                                               Previously seen
                                               Previously seen
Impression

 SIUP at 30+5 weeks
 Umbilical cord cysts
 Normal interval anatomy; anatomic survey complete
 Normal amniotic fluid volume
 Appropriate interval growth with EFW at the 52nd %tile
 Fibroid uterus: see above for size and location
 EV views of cervix: normal length without funneling; edge of
 posterior placenta > 2 cms from internal os ie. resolved low-
 lying placenta
Recommendations

 Continue serial ultrasounds for growth q 4 weeks; please let
 us know if you would like ARISSA/Ar Digiovanni here

## 2016-10-17 DIAGNOSIS — E0500 Thyrotoxicosis with diffuse goiter without thyrotoxic crisis or storm: Secondary | ICD-10-CM | POA: Diagnosis not present

## 2016-10-25 DIAGNOSIS — Z01419 Encounter for gynecological examination (general) (routine) without abnormal findings: Secondary | ICD-10-CM | POA: Diagnosis not present

## 2016-10-25 DIAGNOSIS — Z1151 Encounter for screening for human papillomavirus (HPV): Secondary | ICD-10-CM | POA: Diagnosis not present

## 2016-10-25 DIAGNOSIS — Z6827 Body mass index (BMI) 27.0-27.9, adult: Secondary | ICD-10-CM | POA: Diagnosis not present

## 2016-11-19 NOTE — L&D Delivery Note (Signed)
Delivery Note At 10:45 PM a viable and healthy female was delivered via Vaginal, Spontaneous Delivery (Presentation:ROA ; vtx ).  APGAR: 9, 9; weight pending .   Placenta status: spontaneous, intact not sent, .  Cord:  with the following complications: .none  Cord pH: none  Anesthesia:  epidural Episiotomy: None Lacerations: None Suture Repair: n/a Est. Blood Loss (mL): 50  Mom to postpartum.  Baby to Couplet care / Skin to Skin.  Anna Ingram A 07/08/2017, 11:31 PM

## 2016-11-21 DIAGNOSIS — Z3A01 Less than 8 weeks gestation of pregnancy: Secondary | ICD-10-CM | POA: Diagnosis not present

## 2016-11-21 DIAGNOSIS — Z3201 Encounter for pregnancy test, result positive: Secondary | ICD-10-CM | POA: Diagnosis not present

## 2016-11-21 DIAGNOSIS — O209 Hemorrhage in early pregnancy, unspecified: Secondary | ICD-10-CM | POA: Diagnosis not present

## 2016-11-23 DIAGNOSIS — Z3201 Encounter for pregnancy test, result positive: Secondary | ICD-10-CM | POA: Diagnosis not present

## 2016-12-10 DIAGNOSIS — Z3689 Encounter for other specified antenatal screening: Secondary | ICD-10-CM | POA: Diagnosis not present

## 2016-12-10 DIAGNOSIS — O09521 Supervision of elderly multigravida, first trimester: Secondary | ICD-10-CM | POA: Diagnosis not present

## 2016-12-10 LAB — OB RESULTS CONSOLE RPR: RPR: NONREACTIVE

## 2016-12-10 LAB — OB RESULTS CONSOLE ABO/RH: RH TYPE: POSITIVE

## 2016-12-10 LAB — OB RESULTS CONSOLE HIV ANTIBODY (ROUTINE TESTING): HIV: NONREACTIVE

## 2016-12-10 LAB — OB RESULTS CONSOLE ANTIBODY SCREEN: Antibody Screen: NEGATIVE

## 2016-12-10 LAB — OB RESULTS CONSOLE GC/CHLAMYDIA
Chlamydia: NEGATIVE
Gonorrhea: NEGATIVE

## 2016-12-10 LAB — OB RESULTS CONSOLE RUBELLA ANTIBODY, IGM: Rubella: IMMUNE

## 2016-12-10 LAB — OB RESULTS CONSOLE HEPATITIS B SURFACE ANTIGEN: Hepatitis B Surface Ag: NEGATIVE

## 2016-12-17 DIAGNOSIS — Z113 Encounter for screening for infections with a predominantly sexual mode of transmission: Secondary | ICD-10-CM | POA: Diagnosis not present

## 2016-12-17 DIAGNOSIS — Z3A10 10 weeks gestation of pregnancy: Secondary | ICD-10-CM | POA: Diagnosis not present

## 2016-12-17 DIAGNOSIS — O3411 Maternal care for benign tumor of corpus uteri, first trimester: Secondary | ICD-10-CM | POA: Diagnosis not present

## 2016-12-17 DIAGNOSIS — Z3689 Encounter for other specified antenatal screening: Secondary | ICD-10-CM | POA: Diagnosis not present

## 2017-01-08 DIAGNOSIS — E0500 Thyrotoxicosis with diffuse goiter without thyrotoxic crisis or storm: Secondary | ICD-10-CM | POA: Diagnosis not present

## 2017-01-17 DIAGNOSIS — O09522 Supervision of elderly multigravida, second trimester: Secondary | ICD-10-CM | POA: Diagnosis not present

## 2017-01-17 DIAGNOSIS — O3412 Maternal care for benign tumor of corpus uteri, second trimester: Secondary | ICD-10-CM | POA: Diagnosis not present

## 2017-01-17 DIAGNOSIS — Z3A14 14 weeks gestation of pregnancy: Secondary | ICD-10-CM | POA: Diagnosis not present

## 2017-01-29 DIAGNOSIS — Z361 Encounter for antenatal screening for raised alphafetoprotein level: Secondary | ICD-10-CM | POA: Diagnosis not present

## 2017-02-12 DIAGNOSIS — E0500 Thyrotoxicosis with diffuse goiter without thyrotoxic crisis or storm: Secondary | ICD-10-CM | POA: Diagnosis not present

## 2017-02-18 DIAGNOSIS — Z3A19 19 weeks gestation of pregnancy: Secondary | ICD-10-CM | POA: Diagnosis not present

## 2017-02-18 DIAGNOSIS — O09522 Supervision of elderly multigravida, second trimester: Secondary | ICD-10-CM | POA: Diagnosis not present

## 2017-02-18 DIAGNOSIS — O3412 Maternal care for benign tumor of corpus uteri, second trimester: Secondary | ICD-10-CM | POA: Diagnosis not present

## 2017-03-18 DIAGNOSIS — O99280 Endocrine, nutritional and metabolic diseases complicating pregnancy, unspecified trimester: Secondary | ICD-10-CM | POA: Diagnosis not present

## 2017-03-18 DIAGNOSIS — Z3A23 23 weeks gestation of pregnancy: Secondary | ICD-10-CM | POA: Diagnosis not present

## 2017-04-18 DIAGNOSIS — Z3A27 27 weeks gestation of pregnancy: Secondary | ICD-10-CM | POA: Diagnosis not present

## 2017-04-18 DIAGNOSIS — O99282 Endocrine, nutritional and metabolic diseases complicating pregnancy, second trimester: Secondary | ICD-10-CM | POA: Diagnosis not present

## 2017-04-18 DIAGNOSIS — Z3689 Encounter for other specified antenatal screening: Secondary | ICD-10-CM | POA: Diagnosis not present

## 2017-05-01 DIAGNOSIS — Z3A29 29 weeks gestation of pregnancy: Secondary | ICD-10-CM | POA: Diagnosis not present

## 2017-05-01 DIAGNOSIS — O99283 Endocrine, nutritional and metabolic diseases complicating pregnancy, third trimester: Secondary | ICD-10-CM | POA: Diagnosis not present

## 2017-05-01 DIAGNOSIS — Z3689 Encounter for other specified antenatal screening: Secondary | ICD-10-CM | POA: Diagnosis not present

## 2017-05-08 DIAGNOSIS — E039 Hypothyroidism, unspecified: Secondary | ICD-10-CM | POA: Diagnosis not present

## 2017-05-08 DIAGNOSIS — E0500 Thyrotoxicosis with diffuse goiter without thyrotoxic crisis or storm: Secondary | ICD-10-CM | POA: Diagnosis not present

## 2017-05-17 DIAGNOSIS — Z3A32 32 weeks gestation of pregnancy: Secondary | ICD-10-CM | POA: Diagnosis not present

## 2017-05-17 DIAGNOSIS — O99283 Endocrine, nutritional and metabolic diseases complicating pregnancy, third trimester: Secondary | ICD-10-CM | POA: Diagnosis not present

## 2017-05-29 DIAGNOSIS — O99283 Endocrine, nutritional and metabolic diseases complicating pregnancy, third trimester: Secondary | ICD-10-CM | POA: Diagnosis not present

## 2017-05-29 DIAGNOSIS — Z3A33 33 weeks gestation of pregnancy: Secondary | ICD-10-CM | POA: Diagnosis not present

## 2017-05-29 DIAGNOSIS — Z23 Encounter for immunization: Secondary | ICD-10-CM | POA: Diagnosis not present

## 2017-06-05 DIAGNOSIS — Z3A34 34 weeks gestation of pregnancy: Secondary | ICD-10-CM | POA: Diagnosis not present

## 2017-06-05 DIAGNOSIS — O99283 Endocrine, nutritional and metabolic diseases complicating pregnancy, third trimester: Secondary | ICD-10-CM | POA: Diagnosis not present

## 2017-06-12 DIAGNOSIS — Z3A35 35 weeks gestation of pregnancy: Secondary | ICD-10-CM | POA: Diagnosis not present

## 2017-06-12 DIAGNOSIS — Z369 Encounter for antenatal screening, unspecified: Secondary | ICD-10-CM | POA: Diagnosis not present

## 2017-06-12 DIAGNOSIS — Z3685 Encounter for antenatal screening for Streptococcus B: Secondary | ICD-10-CM | POA: Diagnosis not present

## 2017-06-12 DIAGNOSIS — O99283 Endocrine, nutritional and metabolic diseases complicating pregnancy, third trimester: Secondary | ICD-10-CM | POA: Diagnosis not present

## 2017-06-12 LAB — OB RESULTS CONSOLE GBS: GBS: POSITIVE

## 2017-06-19 DIAGNOSIS — O99283 Endocrine, nutritional and metabolic diseases complicating pregnancy, third trimester: Secondary | ICD-10-CM | POA: Diagnosis not present

## 2017-06-19 DIAGNOSIS — Z3A36 36 weeks gestation of pregnancy: Secondary | ICD-10-CM | POA: Diagnosis not present

## 2017-06-25 DIAGNOSIS — O99283 Endocrine, nutritional and metabolic diseases complicating pregnancy, third trimester: Secondary | ICD-10-CM | POA: Diagnosis not present

## 2017-06-25 DIAGNOSIS — Z3A37 37 weeks gestation of pregnancy: Secondary | ICD-10-CM | POA: Diagnosis not present

## 2017-07-01 DIAGNOSIS — O99283 Endocrine, nutritional and metabolic diseases complicating pregnancy, third trimester: Secondary | ICD-10-CM | POA: Diagnosis not present

## 2017-07-01 DIAGNOSIS — Z3A38 38 weeks gestation of pregnancy: Secondary | ICD-10-CM | POA: Diagnosis not present

## 2017-07-02 ENCOUNTER — Encounter (HOSPITAL_COMMUNITY): Payer: Self-pay | Admitting: *Deleted

## 2017-07-02 ENCOUNTER — Telehealth (HOSPITAL_COMMUNITY): Payer: Self-pay | Admitting: *Deleted

## 2017-07-02 NOTE — Telephone Encounter (Signed)
Preadmission screen  

## 2017-07-03 ENCOUNTER — Inpatient Hospital Stay (HOSPITAL_COMMUNITY)
Admission: AD | Admit: 2017-07-03 | Discharge: 2017-07-03 | Disposition: A | Discharge status: Home / Self Care | Payer: BLUE CROSS/BLUE SHIELD | Source: Ambulatory Visit | Attending: Obstetrics and Gynecology | Admitting: Obstetrics and Gynecology

## 2017-07-03 ENCOUNTER — Encounter (HOSPITAL_COMMUNITY): Payer: Self-pay

## 2017-07-03 DIAGNOSIS — N898 Other specified noninflammatory disorders of vagina: Secondary | ICD-10-CM | POA: Diagnosis not present

## 2017-07-03 DIAGNOSIS — Z3A38 38 weeks gestation of pregnancy: Secondary | ICD-10-CM | POA: Diagnosis not present

## 2017-07-03 DIAGNOSIS — Z0371 Encounter for suspected problem with amniotic cavity and membrane ruled out: Secondary | ICD-10-CM | POA: Diagnosis not present

## 2017-07-03 DIAGNOSIS — O99283 Endocrine, nutritional and metabolic diseases complicating pregnancy, third trimester: Secondary | ICD-10-CM | POA: Diagnosis not present

## 2017-07-03 DIAGNOSIS — Z3A39 39 weeks gestation of pregnancy: Secondary | ICD-10-CM | POA: Diagnosis not present

## 2017-07-03 DIAGNOSIS — O26893 Other specified pregnancy related conditions, third trimester: Secondary | ICD-10-CM | POA: Insufficient documentation

## 2017-07-03 DIAGNOSIS — O4290 Premature rupture of membranes, unspecified as to length of time between rupture and onset of labor, unspecified weeks of gestation: Secondary | ICD-10-CM

## 2017-07-03 LAB — AMNISURE RUPTURE OF MEMBRANE (ROM) NOT AT ARMC: AMNISURE: NEGATIVE

## 2017-07-03 NOTE — MAU Provider Note (Signed)
History     Chief Complaint  Patient presents with  . Rupture of Membranes    41 yo G3P2002 MBF @ 38 5/[redacted] weeks gestation sent from office for amniosure due to wet prep neg in office  With c/o several episodes of fluid leaking along with a lower FH. (+) FM. PNC complicated by GBS cx positive and hyperthyroidism OB History    Gravida Para Term Preterm AB Living   3 2 2  0 0 2   SAB TAB Ectopic Multiple Live Births   0 0 0 0 2      Past Medical History:  Diagnosis Date  . AMA (advanced maternal age) multigravida 76+   . Fibroids   . Hx of varicella   . Hyperthyroidism   . Leiomyoma of uterus   . Mucous polyp of cervix   . Other psoriasis   . Polyhydramnios   . Thalassemia minor     Past Surgical History:  Procedure Laterality Date  . EYE SURGERY     lasik  . TOE SURGERY    . TONSILLECTOMY      Family History  Problem Relation Age of Onset  . Hypertension Mother   . Heart attack Maternal Uncle   . Heart attack Maternal Grandmother     Social History  Substance Use Topics  . Smoking status: Never Smoker  . Smokeless tobacco: Never Used  . Alcohol use No    Allergies: No Known Allergies  Prescriptions Prior to Admission  Medication Sig Dispense Refill Last Dose  . cetirizine (ZYRTEC) 10 MG tablet Take 10 mg by mouth daily as needed for allergies.    07/02/2017 at Unknown time  . Cholecalciferol (VITAMIN D PO) Take 1 tablet by mouth daily.   Past Week at Unknown time  . IRON PO Take 325 mg by mouth daily.   07/02/2017 at Unknown time  . Prenatal Vit-Fe Fumarate-FA (PRENATAL MULTIVITAMIN) TABS Take 1 tablet by mouth at bedtime.   07/02/2017 at Unknown time  . propylthiouracil (PTU) 50 MG tablet Take 50 mg by mouth at bedtime.   07/01/2017     Physical Exam   Blood pressure (!) 95/59, pulse 90, temperature 98.4 F (36.9 C), temperature source Oral, resp. rate 18, height 5\' 7"  (1.702 m), weight 89.4 kg (197 lb), SpO2 100 %, unknown if currently  breastfeeding.  No exam performed today, done in office. wet prep neg.  Tracing: baseline 135-140 (+) accel to 155-160 irreg ctx   ED Course  Leaking fluid Hyperthyroidism on med IUP @ 38 5/7 wks GBS cx (+) P) amniosure. nst MDM  amniosure neg c/w not ruptured  d/c home Labor prec  Zakry Caso A, MD 11:35 AM 07/03/2017

## 2017-07-03 NOTE — Progress Notes (Signed)
Discharge instructions reviewed & given.  Pt verb understanding & has no ques/concerns.

## 2017-07-03 NOTE — MAU Note (Signed)
States felt LOF approx 0300 & intermittent leaking since then, unable to determine if nl vag d/c or ROM.  States clear watery fluid noted.  Denies vag bleeding or UCs.

## 2017-07-05 ENCOUNTER — Other Ambulatory Visit: Payer: Self-pay | Admitting: Obstetrics and Gynecology

## 2017-07-08 ENCOUNTER — Inpatient Hospital Stay (HOSPITAL_COMMUNITY): Payer: BLUE CROSS/BLUE SHIELD | Admitting: Anesthesiology

## 2017-07-08 ENCOUNTER — Encounter (HOSPITAL_COMMUNITY): Payer: Self-pay

## 2017-07-08 ENCOUNTER — Inpatient Hospital Stay (HOSPITAL_COMMUNITY)
Admission: RE | Admit: 2017-07-08 | Discharge: 2017-07-10 | DRG: 775 | Disposition: A | Discharge status: Home / Self Care | Payer: BLUE CROSS/BLUE SHIELD | Source: Ambulatory Visit | Attending: Obstetrics and Gynecology | Admitting: Obstetrics and Gynecology

## 2017-07-08 DIAGNOSIS — E0590 Thyrotoxicosis, unspecified without thyrotoxic crisis or storm: Secondary | ICD-10-CM | POA: Diagnosis present

## 2017-07-08 DIAGNOSIS — O99284 Endocrine, nutritional and metabolic diseases complicating childbirth: Secondary | ICD-10-CM | POA: Diagnosis not present

## 2017-07-08 DIAGNOSIS — Z6830 Body mass index (BMI) 30.0-30.9, adult: Secondary | ICD-10-CM

## 2017-07-08 DIAGNOSIS — E669 Obesity, unspecified: Secondary | ICD-10-CM | POA: Diagnosis present

## 2017-07-08 DIAGNOSIS — O99824 Streptococcus B carrier state complicating childbirth: Secondary | ICD-10-CM | POA: Diagnosis present

## 2017-07-08 DIAGNOSIS — Z3490 Encounter for supervision of normal pregnancy, unspecified, unspecified trimester: Secondary | ICD-10-CM | POA: Diagnosis present

## 2017-07-08 DIAGNOSIS — Z23 Encounter for immunization: Secondary | ICD-10-CM | POA: Diagnosis not present

## 2017-07-08 DIAGNOSIS — Z3A39 39 weeks gestation of pregnancy: Secondary | ICD-10-CM

## 2017-07-08 DIAGNOSIS — O99214 Obesity complicating childbirth: Secondary | ICD-10-CM | POA: Diagnosis present

## 2017-07-08 DIAGNOSIS — R94120 Abnormal auditory function study: Secondary | ICD-10-CM | POA: Diagnosis not present

## 2017-07-08 DIAGNOSIS — O99283 Endocrine, nutritional and metabolic diseases complicating pregnancy, third trimester: Secondary | ICD-10-CM

## 2017-07-08 LAB — CBC
HEMATOCRIT: 39 % (ref 36.0–46.0)
HEMOGLOBIN: 12.9 g/dL (ref 12.0–15.0)
MCH: 25.3 pg — ABNORMAL LOW (ref 26.0–34.0)
MCHC: 33.1 g/dL (ref 30.0–36.0)
MCV: 76.5 fL — ABNORMAL LOW (ref 78.0–100.0)
Platelets: 183 10*3/uL (ref 150–400)
RBC: 5.1 MIL/uL (ref 3.87–5.11)
RDW: 14.2 % (ref 11.5–15.5)
WBC: 4.1 10*3/uL (ref 4.0–10.5)

## 2017-07-08 LAB — TYPE AND SCREEN
ABO/RH(D): O POS
ANTIBODY SCREEN: NEGATIVE

## 2017-07-08 LAB — RPR: RPR Ser Ql: NONREACTIVE

## 2017-07-08 MED ORDER — EPHEDRINE 5 MG/ML INJ
10.0000 mg | INTRAVENOUS | Status: DC | PRN
  Filled 2017-07-08: qty 2

## 2017-07-08 MED ORDER — TERBUTALINE SULFATE 1 MG/ML IJ SOLN
0.2500 mg | Freq: Once | INTRAMUSCULAR | Status: DC | PRN
  Filled 2017-07-08: qty 1

## 2017-07-08 MED ORDER — DIPHENHYDRAMINE HCL 50 MG/ML IJ SOLN: 12.5000 mg | INTRAMUSCULAR | Status: DC | PRN

## 2017-07-08 MED ORDER — OXYTOCIN 40 UNITS IN LACTATED RINGERS INFUSION - SIMPLE MED: 2.5000 [IU]/h | INTRAVENOUS | Status: DC

## 2017-07-08 MED ORDER — PENICILLIN G POTASSIUM 5000000 UNITS IJ SOLR
5.0000 10*6.[IU] | Freq: Once | INTRAVENOUS | Status: AC
  Administered 2017-07-08: 5 10*6.[IU] via INTRAVENOUS
  Filled 2017-07-08: qty 5

## 2017-07-08 MED ORDER — BUTORPHANOL TARTRATE 1 MG/ML IJ SOLN
1.0000 mg | INTRAMUSCULAR | Status: DC | PRN
  Filled 2017-07-08: qty 1

## 2017-07-08 MED ORDER — ONDANSETRON HCL 4 MG/2ML IJ SOLN: 4.0000 mg | Freq: Four times a day (QID) | INTRAMUSCULAR | Status: DC | PRN

## 2017-07-08 MED ORDER — LACTATED RINGERS IV SOLN
500.0000 mL | Freq: Once | INTRAVENOUS | Status: AC
  Administered 2017-07-08: 500 mL via INTRAVENOUS

## 2017-07-08 MED ORDER — ACETAMINOPHEN 325 MG PO TABS: 650.0000 mg | ORAL_TABLET | ORAL | Status: DC | PRN

## 2017-07-08 MED ORDER — PENICILLIN G POT IN DEXTROSE 60000 UNIT/ML IV SOLN
3.0000 10*6.[IU] | INTRAVENOUS | Status: DC
  Administered 2017-07-08 (×3): 3 10*6.[IU] via INTRAVENOUS
  Filled 2017-07-08 (×7): qty 50

## 2017-07-08 MED ORDER — LIDOCAINE HCL (PF) 1 % IJ SOLN
30.0000 mL | INTRAMUSCULAR | Status: DC | PRN
  Filled 2017-07-08: qty 30

## 2017-07-08 MED ORDER — OXYTOCIN 40 UNITS IN LACTATED RINGERS INFUSION - SIMPLE MED
1.0000 m[IU]/min | INTRAVENOUS | Status: DC
  Administered 2017-07-08: 2 m[IU]/min via INTRAVENOUS
  Filled 2017-07-08: qty 1000

## 2017-07-08 MED ORDER — OXYTOCIN BOLUS FROM INFUSION
500.0000 mL | Freq: Once | INTRAVENOUS | Status: AC
  Administered 2017-07-08: 500 mL via INTRAVENOUS

## 2017-07-08 MED ORDER — FENTANYL 2.5 MCG/ML BUPIVACAINE 1/10 % EPIDURAL INFUSION (WH - ANES)
14.0000 mL/h | INTRAMUSCULAR | Status: DC | PRN
  Administered 2017-07-08: 14 mL/h via EPIDURAL
  Filled 2017-07-08: qty 100

## 2017-07-08 MED ORDER — MISOPROSTOL 25 MCG QUARTER TABLET
25.0000 ug | ORAL_TABLET | ORAL | Status: DC
  Administered 2017-07-08: 25 ug via VAGINAL
  Filled 2017-07-08 (×4): qty 1

## 2017-07-08 MED ORDER — LACTATED RINGERS IV SOLN
500.0000 mL | INTRAVENOUS | Status: DC | PRN
  Administered 2017-07-08: 500 mL via INTRAVENOUS

## 2017-07-08 MED ORDER — PHENYLEPHRINE 40 MCG/ML (10ML) SYRINGE FOR IV PUSH (FOR BLOOD PRESSURE SUPPORT)
80.0000 ug | PREFILLED_SYRINGE | INTRAVENOUS | Status: DC | PRN
  Administered 2017-07-08: 80 ug via INTRAVENOUS
  Filled 2017-07-08: qty 5

## 2017-07-08 MED ORDER — SOD CITRATE-CITRIC ACID 500-334 MG/5ML PO SOLN: 30.0000 mL | ORAL | Status: DC | PRN

## 2017-07-08 MED ORDER — PHENYLEPHRINE 40 MCG/ML (10ML) SYRINGE FOR IV PUSH (FOR BLOOD PRESSURE SUPPORT)
80.0000 ug | PREFILLED_SYRINGE | INTRAVENOUS | Status: DC | PRN
  Filled 2017-07-08: qty 5
  Filled 2017-07-08: qty 10

## 2017-07-08 MED ORDER — LACTATED RINGERS IV SOLN
INTRAVENOUS | Status: DC
  Administered 2017-07-08 (×3): via INTRAVENOUS

## 2017-07-08 MED ORDER — PENICILLIN G POTASSIUM 5000000 UNITS IJ SOLR
2.5000 10*6.[IU] | INTRAVENOUS | Status: DC
  Filled 2017-07-08: qty 2.5

## 2017-07-08 MED ORDER — LIDOCAINE HCL (PF) 1 % IJ SOLN
INTRAMUSCULAR | Status: DC | PRN
  Administered 2017-07-08: 5 mL via EPIDURAL
  Administered 2017-07-08: 4 mL via EPIDURAL

## 2017-07-08 NOTE — Anesthesia Pain Management Evaluation Note (Signed)
  CRNA Pain Management Visit Note  Patient: Anna Ingram, 41 y.o., female  "Hello I am a member of the anesthesia team at [facility]. We have an anesthesia team available at all times to provide care throughout the hospital, including epidural management and anesthesia for C-section. I don't know your plan for the delivery whether it a natural birth, water birth, IV sedation, nitrous supplementation, doula or epidural, but we want to meet your pain goals."   1.Was your pain managed to your expectations on prior hospitalizations?   Yes   2.What is your expectation for pain management during this hospitalization?     Epidural and keeping options open. Patient is aware of all pain control options.  3.How can we help you reach that goal? Patient keeping pain control options open.  Record the patient's initial score and the patient's pain goal.   Pain: 0  Pain Goal: 6 [facility] wants you to be able to say your pain was always managed very well.  Maxcine Strong L 07/08/2017

## 2017-07-08 NOTE — H&P (Signed)
Anna Ingram is a 41 y.o. female presenting @ 39 3/7 weeks for IOL 2nd to hyperthyroidism and favorable cervix. (+) GBS OB History    Gravida Para Term Preterm AB Living   3 2 2  0 0 2   SAB TAB Ectopic Multiple Live Births   0 0 0 0 2     Past Medical History:  Diagnosis Date  . AMA (advanced maternal age) multigravida 28+   . Fibroids   . Hx of varicella   . Hyperthyroidism   . Leiomyoma of uterus   . Mucous polyp of cervix   . Other psoriasis   . Polyhydramnios   . Thalassemia minor    Past Surgical History:  Procedure Laterality Date  . EYE SURGERY     lasik  . TOE SURGERY    . TONSILLECTOMY     Family History: family history includes Heart attack in her maternal grandmother and maternal uncle; Hypertension in her mother. Social History:  reports that she has never smoked. She has never used smokeless tobacco. She reports that she does not drink alcohol or use drugs.     Maternal Diabetes: No Genetic Screening: Normal Maternal Ultrasounds/Referrals: Normal Fetal Ultrasounds or other Referrals:  None Maternal Substance Abuse:  No Significant Maternal Medications:  Meds include: Other: PTU Significant Maternal Lab Results:  Lab values include: Group B Strep positive Other Comments:  AMA. hyperthyroidism  Review of Systems  All other systems reviewed and are negative.  History Dilation: 4 Effacement (%): 60 Station: -3 Exam by:: Dr. Garwin Brothers Blood pressure (!) 95/58, pulse 91, temperature 98.5 F (36.9 C), temperature source Oral, resp. rate 20, height 5\' 7"  (1.702 m), weight 89.4 kg (197 lb), unknown if currently breastfeeding. Exam Physical Exam  Constitutional: She is oriented to person, place, and time. She appears well-developed and well-nourished.  HENT:  Head: Atraumatic.  Eyes: EOM are normal.  Neck: Neck supple.  Cardiovascular: Regular rhythm.   Respiratory: Effort normal.  GI: Soft.  Musculoskeletal: She exhibits edema.  Neurological: She is  alert and oriented to person, place, and time.  Skin: Skin is warm and dry.    Prenatal labs: ABO, Rh: --/--/O POS (08/20 5631) Antibody: NEG (08/20 4970) Rubella: Immune (01/22 0000) RPR: Nonreactive (01/22 0000)  HBsAg: Negative (01/22 0000)  HIV: Non-reactive (01/22 0000)  GBS: Positive (07/25 0000)   Assessment/Plan: Hyperthyroidism Term gestation (+) GBS P) admit routine cytotec. IV PCN. IV pitocin prn. Epidural prn. Cont thyroid med   Aubrii Sharpless A 07/08/2017, 1:21 PM

## 2017-07-08 NOTE — Anesthesia Preprocedure Evaluation (Signed)
Anesthesia Evaluation  Patient identified by MRN, date of birth, ID band Patient awake    Reviewed: Allergy & Precautions, NPO status , Patient's Chart, lab work & pertinent test results  Airway Mallampati: III  TM Distance: >3 FB Neck ROM: Full    Dental no notable dental hx. (+) Teeth Intact   Pulmonary neg pulmonary ROS,    Pulmonary exam normal breath sounds clear to auscultation       Cardiovascular negative cardio ROS Normal cardiovascular exam Rhythm:Regular Rate:Normal     Neuro/Psych negative neurological ROS  negative psych ROS   GI/Hepatic Neg liver ROS, GERD  ,  Endo/Other  Hyperthyroidism On PTU Obesity  Renal/GU negative Renal ROS  negative genitourinary   Musculoskeletal Psoriasis   Abdominal (+) + obese,   Peds  Hematology  (+) anemia ,   Anesthesia Other Findings   Reproductive/Obstetrics (+) Pregnancy                             Anesthesia Physical Anesthesia Plan  ASA: II  Anesthesia Plan: Epidural   Post-op Pain Management:    Induction:   PONV Risk Score and Plan:   Airway Management Planned: Natural Airway  Additional Equipment:   Intra-op Plan:   Post-operative Plan:   Informed Consent: I have reviewed the patients History and Physical, chart, labs and discussed the procedure including the risks, benefits and alternatives for the proposed anesthesia with the patient or authorized representative who has indicated his/her understanding and acceptance.     Plan Discussed with: Anesthesiologist  Anesthesia Plan Comments:         Anesthesia Quick Evaluation

## 2017-07-08 NOTE — Anesthesia Procedure Notes (Signed)
Epidural Patient location during procedure: OB Start time: 07/08/2017 6:36 PM  Staffing Anesthesiologist: Josephine Igo Performed: anesthesiologist   Preanesthetic Checklist Completed: patient identified, site marked, surgical consent, pre-op evaluation, timeout performed, IV checked, risks and benefits discussed and monitors and equipment checked  Epidural Patient position: sitting Prep: site prepped and draped and DuraPrep Patient monitoring: continuous pulse ox and blood pressure Approach: midline Location: L4-L5 Injection technique: LOR air  Needle:  Needle type: Tuohy  Needle gauge: 17 G Needle length: 9 cm and 9 Needle insertion depth: 5 cm cm Catheter type: closed end flexible Catheter size: 19 Gauge Catheter at skin depth: 10 cm Test dose: negative and Other  Assessment Events: blood not aspirated, injection not painful, no injection resistance, negative IV test and no paresthesia  Additional Notes Patient identified. Risks and benefits discussed including failed block, incomplete  Pain control, post dural puncture headache, nerve damage, paralysis, blood pressure Changes, nausea, vomiting, reactions to medications-both toxic and allergic and post Partum back pain. All questions were answered. Patient expressed understanding and wished to proceed. Sterile technique was used throughout procedure. Epidural site was Dressed with sterile barrier dressing. No paresthesias, signs of intravascular injection Or signs of intrathecal spread were encountered.  Patient was more comfortable after the epidural was dosed. Please see RN's note for documentation of vital signs and FHR which are stable.

## 2017-07-08 NOTE — Progress Notes (Signed)
S: notes some painful ctx  O: BP 110/70   Pulse 82   Temp 98.3 F (36.8 C) (Oral)   Resp 20   Ht 5\' 7"  (1.702 m)   Wt 89.4 kg (197 lb)   BMI 30.85 kg/m   Pitocin 16 miu VE unchanged AROM. Clear fluid IUPC placed  Tracing: baseline 150 (+) good variability Ctx  Couplets/triplets  IMP: hyperthyroidism GBS cx (+) IV PCN Term gestation Latent phase P) left exaggerated sims. Cont with pitocin. Analgesic/epidural

## 2017-07-08 NOTE — Progress Notes (Signed)
S; comfortable Notes some ctx S/p cytotec x 1  O: BP (!) 95/58 (BP Location: Right Arm)   Pulse 91   Temp 98.5 F (36.9 C) (Oral)   Resp 20   Ht 5\' 7"  (1.702 m)   Wt 89.4 kg (197 lb)   BMI 30.85 kg/m  VE 4/60/-3  palp membrane  Tracing: baseline 140 (+) accel Ctx q 2-6 mins  IMP: hyperthyroidism Term gestation (+) GBS cx P) start pitocin. Defer amniotomy until reg ctx

## 2017-07-09 ENCOUNTER — Encounter (HOSPITAL_COMMUNITY): Payer: Self-pay

## 2017-07-09 LAB — CBC
HCT: 38.7 % (ref 36.0–46.0)
Hemoglobin: 12.7 g/dL (ref 12.0–15.0)
MCH: 25 pg — AB (ref 26.0–34.0)
MCHC: 32.8 g/dL (ref 30.0–36.0)
MCV: 76.2 fL — AB (ref 78.0–100.0)
PLATELETS: 202 10*3/uL (ref 150–400)
RBC: 5.08 MIL/uL (ref 3.87–5.11)
RDW: 14.2 % (ref 11.5–15.5)
WBC: 8.5 10*3/uL (ref 4.0–10.5)

## 2017-07-09 MED ORDER — ONDANSETRON HCL 4 MG/2ML IJ SOLN: 4.0000 mg | INTRAMUSCULAR | Status: DC | PRN

## 2017-07-09 MED ORDER — ONDANSETRON HCL 4 MG PO TABS: 4.0000 mg | ORAL_TABLET | ORAL | Status: DC | PRN

## 2017-07-09 MED ORDER — PROPYLTHIOURACIL 50 MG PO TABS
50.0000 mg | ORAL_TABLET | ORAL | Status: DC
  Administered 2017-07-09: 50 mg via ORAL
  Filled 2017-07-09: qty 1

## 2017-07-09 MED ORDER — ZOLPIDEM TARTRATE 5 MG PO TABS: 5.0000 mg | ORAL_TABLET | Freq: Every evening | ORAL | Status: DC | PRN

## 2017-07-09 MED ORDER — COCONUT OIL OIL
1.0000 "application " | TOPICAL_OIL | Status: DC | PRN
  Filled 2017-07-09: qty 120

## 2017-07-09 MED ORDER — WITCH HAZEL-GLYCERIN EX PADS: 1.0000 "application " | MEDICATED_PAD | CUTANEOUS | Status: DC | PRN

## 2017-07-09 MED ORDER — BENZOCAINE-MENTHOL 20-0.5 % EX AERO: 1.0000 "application " | INHALATION_SPRAY | CUTANEOUS | Status: DC | PRN

## 2017-07-09 MED ORDER — PRENATAL MULTIVITAMIN CH
1.0000 | ORAL_TABLET | Freq: Every day | ORAL | Status: DC
  Administered 2017-07-09: 1 via ORAL
  Filled 2017-07-09: qty 1

## 2017-07-09 MED ORDER — IBUPROFEN 600 MG PO TABS
600.0000 mg | ORAL_TABLET | Freq: Four times a day (QID) | ORAL | Status: DC
  Administered 2017-07-09 – 2017-07-10 (×5): 600 mg via ORAL
  Filled 2017-07-09 (×5): qty 1

## 2017-07-09 MED ORDER — SENNOSIDES-DOCUSATE SODIUM 8.6-50 MG PO TABS
2.0000 | ORAL_TABLET | ORAL | Status: DC
  Administered 2017-07-10: 2 via ORAL
  Filled 2017-07-09: qty 2

## 2017-07-09 MED ORDER — DIBUCAINE 1 % RE OINT
1.0000 | TOPICAL_OINTMENT | RECTAL | Status: DC | PRN
Start: 2017-07-09 — End: 2017-07-10

## 2017-07-09 MED ORDER — SIMETHICONE 80 MG PO CHEW: 80.0000 mg | CHEWABLE_TABLET | ORAL | Status: DC | PRN

## 2017-07-09 MED ORDER — ACETAMINOPHEN 325 MG PO TABS: 650.0000 mg | ORAL_TABLET | ORAL | Status: DC | PRN

## 2017-07-09 MED ORDER — DIPHENHYDRAMINE HCL 25 MG PO CAPS: 25.0000 mg | ORAL_CAPSULE | Freq: Four times a day (QID) | ORAL | Status: DC | PRN

## 2017-07-09 MED ORDER — FERROUS SULFATE 325 (65 FE) MG PO TABS
325.0000 mg | ORAL_TABLET | Freq: Two times a day (BID) | ORAL | Status: DC
  Administered 2017-07-09 (×2): 325 mg via ORAL
  Filled 2017-07-09 (×3): qty 1

## 2017-07-09 NOTE — Plan of Care (Signed)
Problem: Activity: Goal: Ability to tolerate increased activity will improve Outcome: Completed/Met Date Met: 07/09/17 Patient able to ambulate well in the room by herself.  Problem: Nutritional: Goal: Dietary intake will improve Outcome: Completed/Met Date Met: 07/09/17 Patient eating well and tolerating PO intake well.  Problem: Urinary Elimination: Goal: Ability to reestablish a normal urinary elimination pattern will improve Outcome: Completed/Met Date Met: 07/09/17 Patient voiding well and adequately.

## 2017-07-09 NOTE — Plan of Care (Signed)
Problem: Pain Management: Goal: General experience of comfort will improve and pain level will decrease Outcome: Completed/Met Date Met: 07/09/17 Patient verbalizes no pain. Pain control/comfort measures reviewed.

## 2017-07-09 NOTE — Lactation Note (Signed)
This note was copied from a baby's chart. Lactation Consultation Note  Patient Name: Anna Ingram [id number] Date: 07/09/2017 Reason for consult: Initial assessment   P3, Ex BF 9 months and 18 months.  Baby crying after assessment.  Suggest mother try latching baby now but mother states she would rather wait. Mother denies problems or concerns.  States breastfeeding is going well but she is a little sore.  She states no cracks or abrasions. Offered to review hand expression but mother states she knows how.  Suggest applying ebm to nipples for soreness and encouraged latch depth. Encouraged mother to call with next feeding to observe latch. Mom encouraged to feed baby 8-12 times/24 hours and with feeding cues.  Mom made aware of O/P services, breastfeeding support groups, community resources, and our phone # for post-discharge questions.      Maternal Data Does the patient have breastfeeding experience prior to this delivery?: Yes  Feeding    LATCH Score                   Interventions    Lactation Tools Discussed/Used     Consult Status Consult Status: Follow-up Date: 07/10/17 Follow-up type: In-patient    [name] [facility] 07/09/2017, 10:49 AM

## 2017-07-09 NOTE — Progress Notes (Addendum)
PPD #1, SVD, IOL for hyperthyroidism and favorable cervix, baby girl "Drema [place]"  S:  Reports feeling good, minimal discomfort  Reports some discomfort on right side of abdomen, but relieved with Motrin             Tolerating po/ No nausea or vomiting / Denies dizziness or SOB             Bleeding is moderate             Pain controlled with Motrin             Up ad lib / ambulatory / voiding QS  Newborn breast feeding - reports baby is latching well, using a pacifier intermittently     O:               VS: BP (!) 89/56 (BP Location: Left Arm)   Pulse 73   Temp 98.1 F (36.7 C) (Oral)   Resp 20   Ht 5\' 7"  (1.702 m)   Wt 89.4 kg (197 lb)   SpO2 100%   Breastfeeding? Unknown   BMI 30.85 kg/m    LABS:              Recent Labs  07/08/17 0819 07/09/17 0555  WBC 4.1 8.5  HGB 12.9 12.7  PLT 183 202               Blood type: --/--/O POS (08/20 0819)  Rubella: Immune (01/22 0000)                     I&O: Intake/Output      08/20 0701 - 08/21 0700 08/21 0701 - 08/22 0700   Blood 50    Total Output 50     Net -50          Urine Occurrence 2 x                  Physical Exam:             Alert and oriented X3  Lungs: Clear and unlabored  Heart: regular rate and rhythm / no mumurs  Abdomen: soft, non-tender, non-distended              Fundus: firm, non-tender, U-1  Perineum: intact, mild edema, no significant erythema or ecchymosis  Lochia: appropriate, no clots  Extremities: trace pedal edema, no calf pain or tenderness    A: PPD # 1, SVD, s/p IOL for Hyperthyroidism and favorable cervix  Hyperthyroidism - on PTU 50mg  daily  Hx. Of Uterine fibroids    Doing well - stable status  P: Routine post partum orders  See lactation today   May shower and ambulate in halls today   Anticipate discharge home tomorrow   Lars Pinks, MSN, CNM [facility]

## 2017-07-09 NOTE — Anesthesia Postprocedure Evaluation (Signed)
Anesthesia Post Note  Patient: Anna Ingram  Procedure(s) Performed: * No procedures listed *     Patient location during evaluation: Mother Baby Anesthesia Type: Epidural Level of consciousness: awake, awake and alert, oriented and patient cooperative Pain management: pain level controlled Vital Signs Assessment: post-procedure vital signs reviewed and stable Respiratory status: spontaneous breathing, nonlabored ventilation and respiratory function stable Cardiovascular status: stable Postop Assessment: no headache, no backache, patient able to bend at knees and no signs of nausea or vomiting Anesthetic complications: no    Last Vitals:  Vitals:   07/09/17 0200 07/09/17 0500  BP: (!) 92/59 (!) 89/56  Pulse: 92 73  Resp:  20  Temp: 36.9 C 36.7 C  SpO2:  100%    Last Pain:  Vitals:   07/09/17 0600  TempSrc:   PainSc: 0-No pain   Pain Goal: Patients Stated Pain Goal: 3 (07/08/17 1930)               Kishawn Pickar L

## 2017-07-10 MED ORDER — IBUPROFEN 600 MG PO TABS: 600.0000 mg | ORAL_TABLET | Freq: Four times a day (QID) | ORAL | 0 refills | Status: AC

## 2017-07-10 NOTE — Discharge Summary (Signed)
Obstetric Discharge Summary Reason for Admission: induction of labor - hyperthyroidism, multiparity Prenatal Procedures: ultrasound Intrapartum Procedures: spontaneous vaginal delivery, GBS prophylaxis and epidural Postpartum Procedures: none Complications-Operative and Postpartum: none Hemoglobin  Date Value Ref Range Status  07/09/2017 12.7 12.0 - 15.0 g/dL Final   HGB  Date Value Ref Range Status  02/17/2016 12.4 11.6 - 15.9 g/dL Final   HCT  Date Value Ref Range Status  07/09/2017 38.7 36.0 - 46.0 % Final  02/17/2016 40.8 34.8 - 46.6 % Final    Physical Exam:  General: alert, cooperative and no distress Lochia: appropriate Uterine Fundus: firm Incision: healing well DVT Evaluation: No evidence of DVT seen on physical exam.  Discharge Diagnoses: Term Pregnancy-delivered / hyperthyroidism on PTU, multiparity  Discharge Information: Date: 07/10/2017 Activity: pelvic rest Diet: routine Medications: PNV, Ibuprofen, Iron and PTU Condition: stable Instructions: refer to practice specific booklet Discharge to: home Follow-up Information    Servando Salina, MD. Schedule an appointment as soon as possible for a visit in 6 week(s).   Specialty:  Obstetrics and Gynecology Why:  send endocrinology as recommended Contact information: [address] [place] [address] [phone]           Newborn Data: Live born female  Birth Weight: 7 lb 8.3 oz (3410 g) APGAR: 9, 9  Home with mother.  Anna Ingram 07/10/2017, 10:11 AM

## 2017-07-10 NOTE — Lactation Note (Signed)
This note was copied from a baby's chart. Lactation Consultation Note  Patient Name: Anna Ingram [id number] Date: 07/10/2017 Reason for consult: Follow-up assessment Baby at 35 hr of life and dyad et for d/c today. Mom repots bilateral sore nipples. No skin break down or bruising noted at the is visit. She has coconut but has only used x1/24hr. Encouraged mom to use her expressed milk and coconut oil after every bf. Discussed baby behavior, feeding frequency, baby belly size, supplementing, pumping, voids, wt loss, breast changes, and nipple care. Mom is aware of lactation services and support group. She will call as needed.    Maternal Data    Feeding    LATCH Score                   Interventions    Lactation Tools Discussed/Used     Consult Status Consult Status: Complete    Denzil Hughes 07/10/2017, 10:42 AM

## 2017-07-10 NOTE — Progress Notes (Signed)
PPD 2 SVD  S:  Reports feeling well - ready to go home             Tolerating po/ No nausea or vomiting             Bleeding is light             Pain controlled with motrin             Up ad lib / ambulatory / voiding QS  Newborn breast feeding  / female O:               VS: BP 96/61 (BP Location: Right Arm)   Pulse 75   Temp 97.9 F (36.6 C) (Oral)   Resp 20   Ht 5\' 7"  (1.702 m)   Wt 89.4 kg (197 lb)   SpO2 99%   Breastfeeding? Unknown   BMI 30.85 kg/m    LABS:              Recent Labs  07/08/17 0819 07/09/17 0555  WBC 4.1 8.5  HGB 12.9 12.7  PLT 183 202               Blood type: --/--/O POS (08/20 4801)  Rubella: Immune (01/22 0000)                               Physical Exam:             Alert and oriented X3  Abdomen: soft, non-tender, non-distended              Fundus: firm, non-tender, U-1  Perineum: no edema  Lochia: light             Extremities: no edema, no calf pain or tenderness  A: PPD # 2   Doing well - stable status  P: Routine post partum orders  DC home - [facility]OB booklet - instructions reviewed  Artelia Laroche CNM, MSN, [place] 07/10/2017, 10:08 AM

## 2017-08-27 DIAGNOSIS — Z130 Encounter for screening for diseases of the blood and blood-forming organs and certain disorders involving the immune mechanism: Secondary | ICD-10-CM | POA: Diagnosis not present

## 2017-09-03 DIAGNOSIS — F4320 Adjustment disorder, unspecified: Secondary | ICD-10-CM | POA: Diagnosis not present

## 2017-09-11 DIAGNOSIS — F4320 Adjustment disorder, unspecified: Secondary | ICD-10-CM | POA: Diagnosis not present

## 2017-09-12 DIAGNOSIS — E0500 Thyrotoxicosis with diffuse goiter without thyrotoxic crisis or storm: Secondary | ICD-10-CM | POA: Diagnosis not present

## 2017-09-18 DIAGNOSIS — F4320 Adjustment disorder, unspecified: Secondary | ICD-10-CM | POA: Diagnosis not present

## 2017-09-25 DIAGNOSIS — F4320 Adjustment disorder, unspecified: Secondary | ICD-10-CM | POA: Diagnosis not present

## 2017-10-01 DIAGNOSIS — F4320 Adjustment disorder, unspecified: Secondary | ICD-10-CM | POA: Diagnosis not present

## 2017-10-08 DIAGNOSIS — F4320 Adjustment disorder, unspecified: Secondary | ICD-10-CM | POA: Diagnosis not present

## 2017-10-14 DIAGNOSIS — F4320 Adjustment disorder, unspecified: Secondary | ICD-10-CM | POA: Diagnosis not present

## 2017-10-23 DIAGNOSIS — F4320 Adjustment disorder, unspecified: Secondary | ICD-10-CM | POA: Diagnosis not present

## 2017-10-30 DIAGNOSIS — F4320 Adjustment disorder, unspecified: Secondary | ICD-10-CM | POA: Diagnosis not present

## 2017-11-27 DIAGNOSIS — F4320 Adjustment disorder, unspecified: Secondary | ICD-10-CM | POA: Diagnosis not present

## 2017-12-18 DIAGNOSIS — F4320 Adjustment disorder, unspecified: Secondary | ICD-10-CM | POA: Diagnosis not present

## 2018-01-01 DIAGNOSIS — F4320 Adjustment disorder, unspecified: Secondary | ICD-10-CM | POA: Diagnosis not present

## 2018-02-12 DIAGNOSIS — F4320 Adjustment disorder, unspecified: Secondary | ICD-10-CM | POA: Diagnosis not present

## 2018-02-26 DIAGNOSIS — F4320 Adjustment disorder, unspecified: Secondary | ICD-10-CM | POA: Diagnosis not present

## 2018-03-12 DIAGNOSIS — F4320 Adjustment disorder, unspecified: Secondary | ICD-10-CM | POA: Diagnosis not present

## 2018-04-02 DIAGNOSIS — F4320 Adjustment disorder, unspecified: Secondary | ICD-10-CM | POA: Diagnosis not present

## 2018-04-23 DIAGNOSIS — Z5181 Encounter for therapeutic drug level monitoring: Secondary | ICD-10-CM | POA: Diagnosis not present

## 2018-04-23 DIAGNOSIS — H5789 Other specified disorders of eye and adnexa: Secondary | ICD-10-CM | POA: Diagnosis not present

## 2018-04-23 DIAGNOSIS — E0590 Thyrotoxicosis, unspecified without thyrotoxic crisis or storm: Secondary | ICD-10-CM | POA: Diagnosis not present

## 2018-04-23 DIAGNOSIS — E0500 Thyrotoxicosis with diffuse goiter without thyrotoxic crisis or storm: Secondary | ICD-10-CM | POA: Diagnosis not present

## 2018-04-25 DIAGNOSIS — F4320 Adjustment disorder, unspecified: Secondary | ICD-10-CM | POA: Diagnosis not present

## 2018-05-09 DIAGNOSIS — F4320 Adjustment disorder, unspecified: Secondary | ICD-10-CM | POA: Diagnosis not present

## 2018-05-28 DIAGNOSIS — E0500 Thyrotoxicosis with diffuse goiter without thyrotoxic crisis or storm: Secondary | ICD-10-CM | POA: Diagnosis not present

## 2018-07-03 DIAGNOSIS — E0590 Thyrotoxicosis, unspecified without thyrotoxic crisis or storm: Secondary | ICD-10-CM | POA: Diagnosis not present

## 2018-07-03 DIAGNOSIS — Z79899 Other long term (current) drug therapy: Secondary | ICD-10-CM | POA: Diagnosis not present

## 2018-08-21 DIAGNOSIS — F4320 Adjustment disorder, unspecified: Secondary | ICD-10-CM | POA: Diagnosis not present

## 2018-08-29 DIAGNOSIS — E0590 Thyrotoxicosis, unspecified without thyrotoxic crisis or storm: Secondary | ICD-10-CM | POA: Diagnosis not present

## 2018-09-08 DIAGNOSIS — F4320 Adjustment disorder, unspecified: Secondary | ICD-10-CM | POA: Diagnosis not present

## 2018-09-15 DIAGNOSIS — Z1151 Encounter for screening for human papillomavirus (HPV): Secondary | ICD-10-CM | POA: Diagnosis not present

## 2018-09-15 DIAGNOSIS — Z01419 Encounter for gynecological examination (general) (routine) without abnormal findings: Secondary | ICD-10-CM | POA: Diagnosis not present

## 2018-09-15 DIAGNOSIS — Z6828 Body mass index (BMI) 28.0-28.9, adult: Secondary | ICD-10-CM | POA: Diagnosis not present

## 2018-09-15 DIAGNOSIS — F4320 Adjustment disorder, unspecified: Secondary | ICD-10-CM | POA: Diagnosis not present

## 2018-10-02 DIAGNOSIS — F4320 Adjustment disorder, unspecified: Secondary | ICD-10-CM | POA: Diagnosis not present

## 2018-10-21 DIAGNOSIS — Z5181 Encounter for therapeutic drug level monitoring: Secondary | ICD-10-CM | POA: Diagnosis not present

## 2018-10-21 DIAGNOSIS — E0590 Thyrotoxicosis, unspecified without thyrotoxic crisis or storm: Secondary | ICD-10-CM | POA: Diagnosis not present

## 2018-10-21 DIAGNOSIS — E0500 Thyrotoxicosis with diffuse goiter without thyrotoxic crisis or storm: Secondary | ICD-10-CM | POA: Diagnosis not present

## 2018-10-23 DIAGNOSIS — F4320 Adjustment disorder, unspecified: Secondary | ICD-10-CM | POA: Diagnosis not present

## 2018-12-11 DIAGNOSIS — F4320 Adjustment disorder, unspecified: Secondary | ICD-10-CM | POA: Diagnosis not present

## 2019-01-06 DIAGNOSIS — E0500 Thyrotoxicosis with diffuse goiter without thyrotoxic crisis or storm: Secondary | ICD-10-CM | POA: Diagnosis not present

## 2019-01-06 DIAGNOSIS — Z0000 Encounter for general adult medical examination without abnormal findings: Secondary | ICD-10-CM | POA: Diagnosis not present

## 2019-01-06 DIAGNOSIS — D563 Thalassemia minor: Secondary | ICD-10-CM | POA: Diagnosis not present

## 2019-01-06 DIAGNOSIS — E0590 Thyrotoxicosis, unspecified without thyrotoxic crisis or storm: Secondary | ICD-10-CM | POA: Diagnosis not present

## 2019-02-05 DIAGNOSIS — F4320 Adjustment disorder, unspecified: Secondary | ICD-10-CM | POA: Diagnosis not present

## 2019-02-23 DIAGNOSIS — F4320 Adjustment disorder, unspecified: Secondary | ICD-10-CM | POA: Diagnosis not present

## 2019-05-04 DIAGNOSIS — Z20828 Contact with and (suspected) exposure to other viral communicable diseases: Secondary | ICD-10-CM | POA: Diagnosis not present

## 2019-07-07 DIAGNOSIS — Z0100 Encounter for examination of eyes and vision without abnormal findings: Secondary | ICD-10-CM | POA: Diagnosis not present

## 2019-07-22 DIAGNOSIS — E0500 Thyrotoxicosis with diffuse goiter without thyrotoxic crisis or storm: Secondary | ICD-10-CM | POA: Diagnosis not present

## 2019-07-22 DIAGNOSIS — Z79899 Other long term (current) drug therapy: Secondary | ICD-10-CM | POA: Diagnosis not present

## 2019-07-22 DIAGNOSIS — E0590 Thyrotoxicosis, unspecified without thyrotoxic crisis or storm: Secondary | ICD-10-CM | POA: Diagnosis not present

## 2019-10-22 DIAGNOSIS — Z5181 Encounter for therapeutic drug level monitoring: Secondary | ICD-10-CM | POA: Diagnosis not present

## 2019-10-22 DIAGNOSIS — E0590 Thyrotoxicosis, unspecified without thyrotoxic crisis or storm: Secondary | ICD-10-CM | POA: Diagnosis not present

## 2019-10-26 DIAGNOSIS — Z1151 Encounter for screening for human papillomavirus (HPV): Secondary | ICD-10-CM | POA: Diagnosis not present

## 2019-10-26 DIAGNOSIS — Z6828 Body mass index (BMI) 28.0-28.9, adult: Secondary | ICD-10-CM | POA: Diagnosis not present

## 2019-10-26 DIAGNOSIS — Z01419 Encounter for gynecological examination (general) (routine) without abnormal findings: Secondary | ICD-10-CM | POA: Diagnosis not present

## 2019-10-26 DIAGNOSIS — Z1231 Encounter for screening mammogram for malignant neoplasm of breast: Secondary | ICD-10-CM | POA: Diagnosis not present

## 2019-11-11 DIAGNOSIS — Z131 Encounter for screening for diabetes mellitus: Secondary | ICD-10-CM | POA: Diagnosis not present

## 2019-11-11 DIAGNOSIS — Z13220 Encounter for screening for lipoid disorders: Secondary | ICD-10-CM | POA: Diagnosis not present

## 2019-11-11 DIAGNOSIS — Z0000 Encounter for general adult medical examination without abnormal findings: Secondary | ICD-10-CM | POA: Diagnosis not present

## 2019-11-11 DIAGNOSIS — Z130 Encounter for screening for diseases of the blood and blood-forming organs and certain disorders involving the immune mechanism: Secondary | ICD-10-CM | POA: Diagnosis not present

## 2019-11-18 ENCOUNTER — Other Ambulatory Visit: Payer: Self-pay | Admitting: Obstetrics and Gynecology

## 2019-12-01 ENCOUNTER — Other Ambulatory Visit: Payer: Self-pay | Admitting: Obstetrics and Gynecology

## 2019-12-05 DIAGNOSIS — Z20828 Contact with and (suspected) exposure to other viral communicable diseases: Secondary | ICD-10-CM | POA: Diagnosis not present

## 2019-12-30 DIAGNOSIS — D509 Iron deficiency anemia, unspecified: Secondary | ICD-10-CM | POA: Diagnosis not present

## 2019-12-30 DIAGNOSIS — R0789 Other chest pain: Secondary | ICD-10-CM | POA: Diagnosis not present

## 2019-12-30 DIAGNOSIS — D72819 Decreased white blood cell count, unspecified: Secondary | ICD-10-CM | POA: Diagnosis not present

## 2019-12-31 DIAGNOSIS — R079 Chest pain, unspecified: Secondary | ICD-10-CM | POA: Diagnosis not present

## 2020-01-04 DIAGNOSIS — R0789 Other chest pain: Secondary | ICD-10-CM | POA: Diagnosis not present

## 2020-01-13 DIAGNOSIS — Z13220 Encounter for screening for lipoid disorders: Secondary | ICD-10-CM | POA: Diagnosis not present

## 2020-01-13 DIAGNOSIS — Z0000 Encounter for general adult medical examination without abnormal findings: Secondary | ICD-10-CM | POA: Diagnosis not present

## 2020-02-09 DIAGNOSIS — Z79899 Other long term (current) drug therapy: Secondary | ICD-10-CM | POA: Diagnosis not present

## 2020-02-09 DIAGNOSIS — E0500 Thyrotoxicosis with diffuse goiter without thyrotoxic crisis or storm: Secondary | ICD-10-CM | POA: Diagnosis not present

## 2020-02-09 DIAGNOSIS — Z7189 Other specified counseling: Secondary | ICD-10-CM | POA: Diagnosis not present

## 2020-04-20 DIAGNOSIS — E0590 Thyrotoxicosis, unspecified without thyrotoxic crisis or storm: Secondary | ICD-10-CM | POA: Diagnosis not present

## 2020-04-20 DIAGNOSIS — Z5181 Encounter for therapeutic drug level monitoring: Secondary | ICD-10-CM | POA: Diagnosis not present

## 2021-01-18 DIAGNOSIS — H903 Sensorineural hearing loss, bilateral: Secondary | ICD-10-CM | POA: Diagnosis not present

## 2021-01-19 DIAGNOSIS — D509 Iron deficiency anemia, unspecified: Secondary | ICD-10-CM | POA: Diagnosis not present

## 2021-01-19 DIAGNOSIS — L409 Psoriasis, unspecified: Secondary | ICD-10-CM | POA: Diagnosis not present

## 2021-01-19 DIAGNOSIS — E0590 Thyrotoxicosis, unspecified without thyrotoxic crisis or storm: Secondary | ICD-10-CM | POA: Diagnosis not present

## 2021-01-19 DIAGNOSIS — E559 Vitamin D deficiency, unspecified: Secondary | ICD-10-CM | POA: Diagnosis not present

## 2021-01-26 DIAGNOSIS — Z13220 Encounter for screening for lipoid disorders: Secondary | ICD-10-CM | POA: Diagnosis not present

## 2021-01-26 DIAGNOSIS — D509 Iron deficiency anemia, unspecified: Secondary | ICD-10-CM | POA: Diagnosis not present

## 2021-01-26 DIAGNOSIS — Z0000 Encounter for general adult medical examination without abnormal findings: Secondary | ICD-10-CM | POA: Diagnosis not present

## 2021-02-09 DIAGNOSIS — E0500 Thyrotoxicosis with diffuse goiter without thyrotoxic crisis or storm: Secondary | ICD-10-CM | POA: Diagnosis not present

## 2021-04-06 DIAGNOSIS — K219 Gastro-esophageal reflux disease without esophagitis: Secondary | ICD-10-CM | POA: Diagnosis not present

## 2021-04-06 DIAGNOSIS — H6123 Impacted cerumen, bilateral: Secondary | ICD-10-CM | POA: Diagnosis not present

## 2021-04-06 DIAGNOSIS — R070 Pain in throat: Secondary | ICD-10-CM | POA: Diagnosis not present

## 2021-05-02 DIAGNOSIS — E0590 Thyrotoxicosis, unspecified without thyrotoxic crisis or storm: Secondary | ICD-10-CM | POA: Diagnosis not present

## 2021-05-02 DIAGNOSIS — Z01419 Encounter for gynecological examination (general) (routine) without abnormal findings: Secondary | ICD-10-CM | POA: Diagnosis not present

## 2021-07-03 DIAGNOSIS — E559 Vitamin D deficiency, unspecified: Secondary | ICD-10-CM | POA: Diagnosis not present

## 2021-07-03 DIAGNOSIS — E538 Deficiency of other specified B group vitamins: Secondary | ICD-10-CM | POA: Diagnosis not present

## 2021-07-03 DIAGNOSIS — D509 Iron deficiency anemia, unspecified: Secondary | ICD-10-CM | POA: Diagnosis not present

## 2021-07-03 DIAGNOSIS — E0590 Thyrotoxicosis, unspecified without thyrotoxic crisis or storm: Secondary | ICD-10-CM | POA: Diagnosis not present

## 2021-07-03 DIAGNOSIS — L409 Psoriasis, unspecified: Secondary | ICD-10-CM | POA: Diagnosis not present

## 2021-07-03 DIAGNOSIS — D259 Leiomyoma of uterus, unspecified: Secondary | ICD-10-CM | POA: Diagnosis not present

## 2021-10-19 DIAGNOSIS — K219 Gastro-esophageal reflux disease without esophagitis: Secondary | ICD-10-CM | POA: Diagnosis not present

## 2021-10-19 DIAGNOSIS — R070 Pain in throat: Secondary | ICD-10-CM | POA: Diagnosis not present

## 2021-12-07 DIAGNOSIS — Z1231 Encounter for screening mammogram for malignant neoplasm of breast: Secondary | ICD-10-CM | POA: Diagnosis not present

## 2022-02-09 DIAGNOSIS — E0500 Thyrotoxicosis with diffuse goiter without thyrotoxic crisis or storm: Secondary | ICD-10-CM | POA: Diagnosis not present

## 2022-04-03 DIAGNOSIS — M722 Plantar fascial fibromatosis: Secondary | ICD-10-CM | POA: Diagnosis not present

## 2022-06-21 DIAGNOSIS — Z01419 Encounter for gynecological examination (general) (routine) without abnormal findings: Secondary | ICD-10-CM | POA: Diagnosis not present

## 2022-07-03 DIAGNOSIS — Z13220 Encounter for screening for lipoid disorders: Secondary | ICD-10-CM | POA: Diagnosis not present

## 2022-07-03 DIAGNOSIS — E0500 Thyrotoxicosis with diffuse goiter without thyrotoxic crisis or storm: Secondary | ICD-10-CM | POA: Diagnosis not present

## 2022-07-03 DIAGNOSIS — Z0000 Encounter for general adult medical examination without abnormal findings: Secondary | ICD-10-CM | POA: Diagnosis not present

## 2022-12-13 DIAGNOSIS — E0590 Thyrotoxicosis, unspecified without thyrotoxic crisis or storm: Secondary | ICD-10-CM | POA: Diagnosis not present

## 2022-12-13 DIAGNOSIS — E559 Vitamin D deficiency, unspecified: Secondary | ICD-10-CM | POA: Diagnosis not present

## 2022-12-13 DIAGNOSIS — D509 Iron deficiency anemia, unspecified: Secondary | ICD-10-CM | POA: Diagnosis not present

## 2022-12-13 DIAGNOSIS — L409 Psoriasis, unspecified: Secondary | ICD-10-CM | POA: Diagnosis not present

## 2022-12-26 DIAGNOSIS — Z1211 Encounter for screening for malignant neoplasm of colon: Secondary | ICD-10-CM | POA: Diagnosis not present

## 2023-01-04 DIAGNOSIS — E0590 Thyrotoxicosis, unspecified without thyrotoxic crisis or storm: Secondary | ICD-10-CM | POA: Diagnosis not present

## 2023-01-04 DIAGNOSIS — D259 Leiomyoma of uterus, unspecified: Secondary | ICD-10-CM | POA: Diagnosis not present

## 2023-01-04 DIAGNOSIS — E538 Deficiency of other specified B group vitamins: Secondary | ICD-10-CM | POA: Diagnosis not present

## 2023-01-04 DIAGNOSIS — D509 Iron deficiency anemia, unspecified: Secondary | ICD-10-CM | POA: Diagnosis not present

## 2023-01-04 DIAGNOSIS — E559 Vitamin D deficiency, unspecified: Secondary | ICD-10-CM | POA: Diagnosis not present

## 2023-03-05 DIAGNOSIS — Z1211 Encounter for screening for malignant neoplasm of colon: Secondary | ICD-10-CM | POA: Diagnosis not present

## 2023-03-11 DIAGNOSIS — E538 Deficiency of other specified B group vitamins: Secondary | ICD-10-CM | POA: Diagnosis not present

## 2023-03-11 DIAGNOSIS — Z1329 Encounter for screening for other suspected endocrine disorder: Secondary | ICD-10-CM | POA: Diagnosis not present

## 2023-03-11 DIAGNOSIS — L409 Psoriasis, unspecified: Secondary | ICD-10-CM | POA: Diagnosis not present

## 2023-03-11 DIAGNOSIS — D509 Iron deficiency anemia, unspecified: Secondary | ICD-10-CM | POA: Diagnosis not present

## 2023-03-27 DIAGNOSIS — N898 Other specified noninflammatory disorders of vagina: Secondary | ICD-10-CM | POA: Diagnosis not present

## 2023-04-10 DIAGNOSIS — H6123 Impacted cerumen, bilateral: Secondary | ICD-10-CM | POA: Diagnosis not present

## 2023-04-10 DIAGNOSIS — H903 Sensorineural hearing loss, bilateral: Secondary | ICD-10-CM | POA: Diagnosis not present

## 2023-04-18 DIAGNOSIS — Z1231 Encounter for screening mammogram for malignant neoplasm of breast: Secondary | ICD-10-CM | POA: Diagnosis not present

## 2023-08-07 DIAGNOSIS — E0590 Thyrotoxicosis, unspecified without thyrotoxic crisis or storm: Secondary | ICD-10-CM | POA: Diagnosis not present

## 2023-08-07 DIAGNOSIS — E538 Deficiency of other specified B group vitamins: Secondary | ICD-10-CM | POA: Diagnosis not present

## 2023-08-07 DIAGNOSIS — L409 Psoriasis, unspecified: Secondary | ICD-10-CM | POA: Diagnosis not present

## 2023-08-07 DIAGNOSIS — E559 Vitamin D deficiency, unspecified: Secondary | ICD-10-CM | POA: Diagnosis not present

## 2023-08-07 DIAGNOSIS — D509 Iron deficiency anemia, unspecified: Secondary | ICD-10-CM | POA: Diagnosis not present

## 2023-10-14 ENCOUNTER — Encounter (INDEPENDENT_AMBULATORY_CARE_PROVIDER_SITE_OTHER): Payer: Self-pay

## 2023-10-14 ENCOUNTER — Ambulatory Visit (INDEPENDENT_AMBULATORY_CARE_PROVIDER_SITE_OTHER): Payer: BC Managed Care – PPO | Admitting: Otolaryngology

## 2023-10-14 VITALS — Ht 67.0 in | Wt 188.0 lb

## 2023-10-14 DIAGNOSIS — H903 Sensorineural hearing loss, bilateral: Secondary | ICD-10-CM | POA: Diagnosis not present

## 2023-10-14 DIAGNOSIS — H6123 Impacted cerumen, bilateral: Secondary | ICD-10-CM | POA: Insufficient documentation

## 2023-10-14 DIAGNOSIS — H6121 Impacted cerumen, right ear: Secondary | ICD-10-CM | POA: Insufficient documentation

## 2023-10-14 NOTE — Progress Notes (Signed)
Patient ID: Anna Ingram, female   DOB: 1976-08-28, 47 y.o.   MRN: [id number]  Follow up: Bilateral hearing loss, recurrent cerumen impaction  HPI: The patient is a 47 year old female who returns today for her follow-up evaluation.  She was last seen 6 months ago.  At that time, she was noted to have bilateral symmetric high-frequency sensorineural hearing loss.  She also has a history of recurrent cerumen impaction.  The patient returns today reporting no significant change in her hearing.  She denies any otalgia, otorrhea, or vertigo.  Exam: General: Communicates without difficulty, well nourished, no acute distress. Head: Normocephalic, no evidence injury, no tenderness, facial buttresses intact without stepoff. Face/sinus: No tenderness to palpation and percussion. Facial movement is normal and symmetric. Eyes: PERRL, EOMI. No scleral icterus, conjunctivae clear. Neuro: CN II exam reveals vision grossly intact.  No nystagmus at any point of gaze. Ears: Auricles well formed without lesions.  EAC: Right ear cerumen impaction.  Under the operating microscope, the cerumen is carefully removed with a combination of cerumen currette, alligator forceps, and suction catheters.  After the cerumen is removed, the TMs are noted to be normal.  Nose: External evaluation reveals normal support and skin without lesions.  Dorsum is intact.  Anterior rhinoscopy reveals congested mucosa over anterior aspect of inferior turbinates and intact septum.  No purulence noted. Oral:  Oral cavity and oropharynx are intact, symmetric, without erythema or edema.  Mucosa is moist without lesions. Neck: Full range of motion without pain.  There is no significant lymphadenopathy.  No masses palpable.  Thyroid bed within normal limits to palpation.  Parotid glands and submandibular glands equal bilaterally without mass.  Trachea is midline. Neuro:  CN 2-12 grossly intact.   Assessment: 1.  Right ear cerumen impaction.  After the  cerumen removal procedure, both tympanic membranes and middle ear spaces are noted to be normal. 2.  Subjectively stable bilateral high-frequency sensorineural hearing loss.  Plan: 1.  Right ear cerumen removal. 2.  The physical exam findings are reviewed with the patient. 3.  The patient will return for reevaluation in 6 months.

## 2023-10-15 DIAGNOSIS — D509 Iron deficiency anemia, unspecified: Secondary | ICD-10-CM | POA: Diagnosis not present

## 2023-10-15 DIAGNOSIS — D259 Leiomyoma of uterus, unspecified: Secondary | ICD-10-CM | POA: Diagnosis not present

## 2023-10-15 DIAGNOSIS — E538 Deficiency of other specified B group vitamins: Secondary | ICD-10-CM | POA: Diagnosis not present

## 2023-10-15 DIAGNOSIS — L409 Psoriasis, unspecified: Secondary | ICD-10-CM | POA: Diagnosis not present

## 2024-04-16 ENCOUNTER — Encounter (INDEPENDENT_AMBULATORY_CARE_PROVIDER_SITE_OTHER): Payer: Self-pay | Admitting: Otolaryngology

## 2024-04-16 ENCOUNTER — Ambulatory Visit (INDEPENDENT_AMBULATORY_CARE_PROVIDER_SITE_OTHER): Payer: BC Managed Care – PPO | Admitting: Otolaryngology

## 2024-04-16 VITALS — BP 119/72 | HR 79

## 2024-04-16 DIAGNOSIS — H6123 Impacted cerumen, bilateral: Secondary | ICD-10-CM

## 2024-04-16 NOTE — Progress Notes (Signed)
 Patient ID: Anna Ingram, female   DOB: 1975-12-29, 48 y.o.   MRN: [id number]  Procedure: Bilateral cerumen disimpaction.   Indication: Cerumen impaction, resulting in ear discomfort and conductive hearing loss.   Description: The patient is placed supine on the operating table. Under the operating microscope, the right ear canal is examined and is noted to be impacted with cerumen. The cerumen is carefully removed with a combination of suction catheters, cerumen curette, and alligator forceps. After the cerumen removal, the ear canal and tympanic membrane are noted to be normal. No middle ear effusion is noted. The same procedure is then repeated on the left side without exception. The patient tolerated the procedure well.  Follow-up care:  The patient is instructed not to use Q-tips to clean the ear canals. The patient will follow up in 9 months.
